# Patient Record
Sex: Male | Born: 1980 | Race: White | Hispanic: No | Marital: Married | Smoking: Current every day smoker
Health system: Southern US, Community
[De-identification: ages and names within clinical notes are randomized; demographics above are authoritative.]

## PROBLEM LIST (undated history)

## (undated) DIAGNOSIS — G43909 Migraine, unspecified, not intractable, without status migrainosus: Secondary | ICD-10-CM

## (undated) DIAGNOSIS — F419 Anxiety disorder, unspecified: Secondary | ICD-10-CM

## (undated) HISTORY — PX: WISDOM TOOTH EXTRACTION: SHX21

## (undated) HISTORY — DX: Anxiety disorder, unspecified: F41.9

## (undated) HISTORY — DX: Migraine, unspecified, not intractable, without status migrainosus: G43.909

---

## 2002-12-13 ENCOUNTER — Emergency Department (HOSPITAL_COMMUNITY): Admission: EM | Admit: 2002-12-13 | Discharge: 2002-12-14 | Payer: Self-pay | Admitting: Emergency Medicine

## 2002-12-13 ENCOUNTER — Encounter: Payer: Self-pay | Admitting: Emergency Medicine

## 2009-10-29 ENCOUNTER — Emergency Department (HOSPITAL_COMMUNITY): Admission: EM | Admit: 2009-10-29 | Discharge: 2009-10-29 | Payer: Self-pay | Admitting: Emergency Medicine

## 2010-08-09 LAB — CK TOTAL AND CKMB (NOT AT ARMC): Relative Index: INVALID (ref 0.0–2.5)

## 2010-08-09 LAB — CBC
HCT: 45.8 % (ref 39.0–52.0)
MCV: 99 fL (ref 78.0–100.0)
RBC: 4.62 MIL/uL (ref 4.22–5.81)
WBC: 12.6 10*3/uL — ABNORMAL HIGH (ref 4.0–10.5)

## 2010-08-09 LAB — COMPREHENSIVE METABOLIC PANEL
BUN: 19 mg/dL (ref 6–23)
CO2: 30 mEq/L (ref 19–32)
Calcium: 9.5 mg/dL (ref 8.4–10.5)
Chloride: 102 mEq/L (ref 96–112)
Creatinine, Ser: 1.09 mg/dL (ref 0.4–1.5)
GFR calc Af Amer: >60 mL/min (ref >60)
GFR calc non Af Amer: >60 mL/min (ref >60)
Glucose, Bld: 89 mg/dL (ref 70–99)
Total Bilirubin: 0.4 mg/dL (ref 0.3–1.2)

## 2010-08-09 LAB — DIFFERENTIAL
Basophils Absolute: 0.1 10*3/uL (ref 0.0–0.1)
Basophils Relative: 1 % (ref 0–1)
Neutro Abs: 8.7 10*3/uL — ABNORMAL HIGH (ref 1.7–7.7)
Neutrophils Relative %: 69 % (ref 43–77)

## 2010-08-09 LAB — TROPONIN I: Troponin I: <0.01 ng/mL (ref 0.00–0.06)

## 2012-10-03 ENCOUNTER — Other Ambulatory Visit: Payer: Self-pay | Admitting: Otolaryngology

## 2012-10-03 DIAGNOSIS — R2689 Other abnormalities of gait and mobility: Secondary | ICD-10-CM

## 2012-10-03 DIAGNOSIS — Z139 Encounter for screening, unspecified: Secondary | ICD-10-CM

## 2012-10-05 ENCOUNTER — Ambulatory Visit
Admission: RE | Admit: 2012-10-05 | Discharge: 2012-10-05 | Disposition: A | Discharge status: Home / Self Care | Payer: BC Managed Care – PPO | Source: Ambulatory Visit | Attending: Otolaryngology | Admitting: Otolaryngology

## 2012-10-05 DIAGNOSIS — Z139 Encounter for screening, unspecified: Secondary | ICD-10-CM

## 2012-10-05 DIAGNOSIS — R2689 Other abnormalities of gait and mobility: Secondary | ICD-10-CM

## 2012-10-05 MED ORDER — GADOBENATE DIMEGLUMINE 529 MG/ML IV SOLN
17.0000 mL | Freq: Once | INTRAVENOUS | Status: AC | PRN
  Administered 2012-10-05: 17 mL via INTRAVENOUS

## 2012-10-10 ENCOUNTER — Encounter: Payer: Self-pay | Admitting: Neurology

## 2012-10-10 DIAGNOSIS — R42 Dizziness and giddiness: Secondary | ICD-10-CM

## 2012-10-11 ENCOUNTER — Ambulatory Visit (INDEPENDENT_AMBULATORY_CARE_PROVIDER_SITE_OTHER): Payer: BC Managed Care – PPO | Admitting: Neurology

## 2012-10-11 ENCOUNTER — Encounter: Payer: Self-pay | Admitting: Neurology

## 2012-10-11 VITALS — BP 128/75 | HR 74 | Ht 69.0 in | Wt 181.5 lb

## 2012-10-11 DIAGNOSIS — R42 Dizziness and giddiness: Secondary | ICD-10-CM

## 2012-10-11 NOTE — Progress Notes (Signed)
History of present illness: Eddie Parrish is a 32 years old right-handed Caucasian male, referred by his primary care physician Dr. Duane Lope, and also ENT Dr. Jenne Pane for evaluation of constellation of complaints  He reported history of anxiety for 3 years, is under their psychiatrist Dr.Cottle, tried multiple medications in the past, currently taking Xanax 0 point 5 mg 4 times a day for 2 years  3 years ago, he presented with prolonged episode of dizziness, lightheadedness, anxiety, blurry vision, his body symptoms also increase his anxiety, was actually presented to the emergency room in 2011, CAT scan of the brain was normal, laboratory evaluation showed mild elevated WBC, otherwise normal CMP,  In 2013, over 3 months period of time, he had frequent ocular migraine, see wiggly lines in his visual field, lasting 15-20 minutes, followed by severe pounding headache, lasting for couple hours, he usually take Advil, Tylenol,  His symptoms overall has been stable over the past 3 years, especially with Xanax treatment, but he continued to feel lightheadedness, dizziness, body been pulled towards the right side  He had MRI of the brain May 2014, we  reviewed the films together, which was essentially normal.  He denies loss of vision, no lateralized motor or sensory deficit,     Review of Systems  Out of a complete 14 system review, the patient complains of only the following symptoms, and all other reviewed systems are negative.   Constitutional:   Fatigue Cardiovascular:  N/A Ear/Nose/Throat:  N/A Skin: N/A Eyes: Blurred vision  Respiratory: snoring  Gastroitestinal: N/A    Hematology/Lymphatic:  N/A Endocrine:  N/A Musculoskeletal:N/A Allergy/Immunology:  allergy  Neurological:  dizziness snoring  Psychiatric:    Anxiety not enough sleep decreased energy racing thoughts   PHYSICAL EXAMINATOINS:  Generalized: In no acute distress  Neck: Supple, no carotid bruits   Cardiac: Regular rate  rhythm  Pulmonary: Clear to auscultation bilaterally  Musculoskeletal: No deformity  Neurological examination  Mentation: Alert oriented to time, place, history taking, and causual conversation  Cranial nerve II-XII: Pupils were equal round reactive to light extraocular movements were full, visual field were full on confrontational test. facial sensation and strength were normal. hearing was intact to finger rubbing bilaterally. Uvula tongue midline.  head turning and shoulder shrug and were normal and symmetric.Tongue protrusion into cheek strength was normal.  Motor: normal tone, bulk and strength.  Sensory: Intact to fine touch, pinprick, preserved vibratory sensation, and proprioception at toes.  Coordination: Normal finger to nose, heel-to-shin bilaterally there was no truncal ataxia  Gait: Rising up from seated position without assistance, normal stance, without trunk ataxia, moderate stride, good arm swing, smooth turning, able to perform tiptoe, and heel walking without difficulty.   Romberg signs: Negative  Deep tendon reflexes: Brachioradialis 2/2, biceps 2/2, triceps 2/2, patellar 2/2, Achilles 2/2, plantar responses were flexor bilaterally.   Assessment plan,  32 years old right-handed Caucasian male, with past medical history of anxiety, and chronic and Xanax treatment, presenting with a 3 years history of intermittent dizziness, lightheadedness, body pulled towards the right side, ocular migraine, normal neurological examination, normal MRI of the brain  1 most consistent with anxiety 2 I have suggested him long term SSRI treatment, he will continue followup with his psychiatrist Dr. Jennelle Human 3 return to clinic as needed.

## 2015-09-25 ENCOUNTER — Ambulatory Visit
Admission: RE | Admit: 2015-09-25 | Discharge: 2015-09-25 | Disposition: A | Discharge status: Home / Self Care | Payer: Managed Care, Other (non HMO) | Source: Ambulatory Visit | Attending: Family Medicine | Admitting: Family Medicine

## 2015-09-25 ENCOUNTER — Other Ambulatory Visit: Payer: Self-pay | Admitting: Family Medicine

## 2015-09-25 DIAGNOSIS — J329 Chronic sinusitis, unspecified: Secondary | ICD-10-CM

## 2018-05-11 ENCOUNTER — Other Ambulatory Visit: Payer: Self-pay

## 2018-05-11 MED ORDER — ALPRAZOLAM 0.5 MG PO TABS: ORAL_TABLET | ORAL | 2 refills | Status: DC

## 2018-05-11 MED ORDER — ALPRAZOLAM 0.5 MG PO TABS: 2.0000 mg | ORAL_TABLET | Freq: Four times a day (QID) | ORAL | 2 refills | Status: DC

## 2018-06-26 ENCOUNTER — Encounter: Payer: Self-pay | Admitting: Emergency Medicine

## 2018-06-26 DIAGNOSIS — F4001 Agoraphobia with panic disorder: Secondary | ICD-10-CM

## 2018-06-26 DIAGNOSIS — F410 Panic disorder [episodic paroxysmal anxiety] without agoraphobia: Secondary | ICD-10-CM

## 2018-08-07 ENCOUNTER — Other Ambulatory Visit: Payer: Self-pay | Admitting: Psychiatry

## 2018-08-08 NOTE — Telephone Encounter (Signed)
Ok to call in unable to leave voicemail with pharmacy

## 2018-08-08 NOTE — Telephone Encounter (Signed)
Need to confirm paper chart not seen in epic

## 2018-09-27 ENCOUNTER — Ambulatory Visit: Payer: Self-pay | Admitting: Psychiatry

## 2018-09-27 ENCOUNTER — Other Ambulatory Visit: Payer: Self-pay

## 2018-10-11 ENCOUNTER — Encounter: Payer: Self-pay | Admitting: Psychiatry

## 2018-10-11 ENCOUNTER — Ambulatory Visit (INDEPENDENT_AMBULATORY_CARE_PROVIDER_SITE_OTHER): Payer: Managed Care, Other (non HMO) | Admitting: Psychiatry

## 2018-10-11 ENCOUNTER — Other Ambulatory Visit: Payer: Self-pay

## 2018-10-11 DIAGNOSIS — F4001 Agoraphobia with panic disorder: Secondary | ICD-10-CM | POA: Diagnosis not present

## 2018-10-11 MED ORDER — PAROXETINE HCL 20 MG PO TABS: 20.0000 mg | ORAL_TABLET | Freq: Every day | ORAL | 3 refills | Status: DC

## 2018-10-11 MED ORDER — ALPRAZOLAM 0.5 MG PO TABS: ORAL_TABLET | ORAL | 5 refills | Status: DC

## 2018-10-11 MED ORDER — ALPRAZOLAM ER 0.5 MG PO TB24: 0.5000 mg | ORAL_TABLET | Freq: Two times a day (BID) | ORAL | 5 refills | Status: DC

## 2018-10-11 NOTE — Progress Notes (Signed)
Eddie Parrish [id number] 1980-11-08 38 y.o.  Virtual Visit via Telephone Note  I connected with@ on 10/11/18 at 11:00 AM EDT by telephone and verified that I am speaking with the correct person using two identifiers.   I discussed the limitations, risks, security and privacy concerns of performing an evaluation and management service by telephone and the availability of in person appointments. I also discussed with the patient that there may be a patient responsible charge related to this service. The patient expressed understanding and agreed to proceed.   I discussed the assessment and treatment plan with the patient. The patient was provided an opportunity to ask questions and all were answered. The patient agreed with the plan and demonstrated an understanding of the instructions.   The patient was advised to call back or seek an in-person evaluation if the symptoms worsen or if the condition fails to improve as anticipated.  I provided 15 minutes of non-face-to-face time during this encounter.  The patient was located at home.  The provider was located at home.   Eddie Shoemaker, MD   Subjective:   Patient ID:  Eddie Parrish is a 38 y.o. (DOB October 29, 1980) male.  Chief Complaint:  Chief Complaint  Patient presents with  . Follow-up    Medication Management  . Anxiety    Medication Management    HPI Eddie Parrish presents for follow-up of panic and GAD.  Last seen August 2019.  No med changes made.  Pretty balanced.  Stable.  Happy with meds and feels he needs all the Xanax and it's smooth throughout the day.  Patient reports stable mood and denies depressed or irritable moods.  Anxiety manageable with meds.  Patient denies difficulty with sleep initiation or maintenance. Denies appetite disturbance.  Patient reports that energy and motivation have been good.  Patient denies any difficulty with concentration.  Patient denies any suicidal ideation.  Kids including baby and  wife Eddie Parrish.  [name] skin problem managed at [facility].  Past Psychiatric Medication Trials:  Zoloft worse anxiety, Klonopin, melatonin NR, trazodone   Review of Systems:  Review of Systems  Neurological: Negative for tremors and weakness.    Medications: I have reviewed the patient's current medications.  Current Outpatient Medications  Medication Sig Dispense Refill  . ALPRAZolam (XANAX) 0.5 MG tablet Take 1 tablet by mouth four times daily. 120 tablet 2  . ALPRAZOLAM XR 0.5 MG 24 hr tablet TAKE 1 TABLET BY MOUTH TWICE DAILY 60 tablet 2  . PARoxetine (PAXIL) 20 MG tablet Take 20 mg by mouth daily.     No current facility-administered medications for this visit.     Medication Side Effects: None  Allergies:  Allergies  Allergen Reactions  . Ativan [Lorazepam]   . Keflex [Cephalexin]     Past Medical History:  Diagnosis Date  . Anxiety   . Migraine     Family History  Problem Relation Age of Onset  . Cancer Unknown   . Lung cancer Unknown   . Liver cancer Unknown     Social History   Socioeconomic History  . Marital status: Married    Spouse name: Not on file  . Number of children: Not on file  . Years of education: Not on file  . Highest education level: Not on file  Occupational History  . Not on file  Social Needs  . Financial resource strain: Not on file  . Food insecurity:    Worry: Not on file  Inability: Not on file  . Transportation needs:    Medical: Not on file    Non-medical: Not on file  Tobacco Use  . Smoking status: Current Every Day Smoker    Packs/day: 0.50    Types: Cigarettes  . Smokeless tobacco: Never Used  Substance and Sexual Activity  . Alcohol use: Yes    Comment: 10 beers weekly  . Drug use: No  . Sexual activity: Not on file  Lifestyle  . Physical activity:    Days per week: Not on file    Minutes per session: Not on file  . Stress: Not on file  Relationships  . Social connections:    Talks on phone: Not on file     Gets together: Not on file    Attends religious service: Not on file    Active member of club or organization: Not on file    Attends meetings of clubs or organizations: Not on file    Relationship status: Not on file  . Intimate partner violence:    Fear of current or ex partner: Not on file    Emotionally abused: Not on file    Physically abused: Not on file    Forced sexual activity: Not on file  Other Topics Concern  . Not on file  Social History Narrative   Patient lives at home with his wife and has a high school education. Patient works at [employer].      Past Medical History, Surgical history, Social history, and Family history were reviewed and updated as appropriate.   Please see review of systems for further details on the patient's review from today.   Objective:   Physical Exam:  There were no vitals taken for this visit.  Physical Exam Neurological:     Mental Status: He is alert and oriented to person, place, and time.     Cranial Nerves: No dysarthria.  Psychiatric:        Attention and Perception: Attention normal.        Mood and Affect: Mood normal.        Speech: Speech normal.        Behavior: Behavior is cooperative.        Thought Content: Thought content normal. Thought content is not paranoid or delusional. Thought content does not include homicidal or suicidal ideation. Thought content does not include homicidal or suicidal plan.        Cognition and Memory: Cognition and memory normal.        Judgment: Judgment normal.     Comments: Anxiety managed.     Lab Review:     Component Value Date/Time   NA 137 10/29/2009 1530   K 4.3 10/29/2009 1530   CL 102 10/29/2009 1530   CO2 30 10/29/2009 1530   GLUCOSE 89 10/29/2009 1530   BUN 19 10/29/2009 1530   CREATININE 1.09 10/29/2009 1530   CALCIUM 9.5 10/29/2009 1530   PROT 7.8 10/29/2009 1530   ALBUMIN 4.7 10/29/2009 1530   AST 21 10/29/2009 1530   ALT 22 10/29/2009 1530   ALKPHOS 30 (L)  10/29/2009 1530   BILITOT 0.4 10/29/2009 1530   GFRNONAA >60 10/29/2009 1530   GFRAA  10/29/2009 1530    >60        The eGFR has been calculated using the MDRD equation. This calculation has not been validated in all clinical situations. eGFR's persistently <60 mL/min signify possible Chronic Kidney Disease.  Component Value Date/Time   WBC 12.6 (H) 10/29/2009 1530   RBC 4.62 10/29/2009 1530   HGB 15.7 10/29/2009 1530   HCT 45.8 10/29/2009 1530   PLT 226 10/29/2009 1530   MCV 99.0 10/29/2009 1530   MCHC 34.4 10/29/2009 1530   RDW 12.8 10/29/2009 1530   LYMPHSABS 2.8 10/29/2009 1530   MONOABS 0.8 10/29/2009 1530   EOSABS 0.1 10/29/2009 1530   BASOSABS 0.1 10/29/2009 1530    No results found for: POCLITH, LITHIUM   No results found for: PHENYTOIN, PHENOBARB, VALPROATE, CBMZ   .res Assessment: Plan:    Panic disorder with agoraphobia   Ej is had a good response with a combination of paroxetine plus Xanax for his panic disorder.  Attempts to reduce the Xanax have failed and had recurrence of panic.  Higher doses of paroxetine because side effects.  He gets a very smooth benefit and effect response using the combination of Xanax XR 0.5 mg twice daily and immediate release Xanax 0.5 4 times daily.  He is consistent.  There is no evidence of abuse or tolerance or misuse.  He will continue on Paxil 20 mg daily.  No med changes today  We discussed the short-term risks associated with benzodiazepines including sedation and increased fall risk among others.  Discussed long-term side effect risk including dependence, potential withdrawal symptoms, and the potential eventual dose-related risk of dementia.  Cautioned about use of alcohol with alprazolam.  Follow-up 1 year because of stability and consistency  Lynder Parents, MD, DFAPA Please see After Visit Summary for patient specific instructions.  No future appointments.  No orders of the defined types were placed in  this encounter.     -------------------------------

## 2019-04-27 ENCOUNTER — Other Ambulatory Visit: Payer: Self-pay | Admitting: Psychiatry

## 2019-04-27 DIAGNOSIS — F4001 Agoraphobia with panic disorder: Secondary | ICD-10-CM

## 2019-05-08 ENCOUNTER — Other Ambulatory Visit: Payer: Self-pay | Admitting: Psychiatry

## 2019-05-08 DIAGNOSIS — F4001 Agoraphobia with panic disorder: Secondary | ICD-10-CM

## 2019-05-09 NOTE — Telephone Encounter (Signed)
5 refills okay? Due back May 2021

## 2019-06-07 ENCOUNTER — Other Ambulatory Visit: Payer: Self-pay | Admitting: Psychiatry

## 2019-06-07 DIAGNOSIS — F4001 Agoraphobia with panic disorder: Secondary | ICD-10-CM

## 2019-06-11 ENCOUNTER — Telehealth: Payer: Self-pay | Admitting: Psychiatry

## 2019-06-11 ENCOUNTER — Telehealth: Payer: Self-pay

## 2019-06-11 NOTE — Telephone Encounter (Signed)
Prior authorization approved for Alprazolam XR 0.5 mg bid #60 for 30 days with [employer] ID# [id number]

## 2019-06-11 NOTE — Telephone Encounter (Signed)
Megan @ [facility] needs confirmation on Rx for PA. Alprazolam XR 2/d 24 hr tab. She stated should be 1/d as scheduled. Need ASAP request reads URGENT

## 2019-06-11 NOTE — Telephone Encounter (Signed)
Rtc to [facility], had to leave voicemail to confirm patient's dose of Alprazolam Xr 0.5 mg 1 bid. Hope that helps answer her question.

## 2019-08-23 ENCOUNTER — Ambulatory Visit: Payer: PRIVATE HEALTH INSURANCE | Attending: Internal Medicine

## 2019-08-23 DIAGNOSIS — Z23 Encounter for immunization: Secondary | ICD-10-CM

## 2019-08-23 NOTE — Progress Notes (Signed)
   Covid-19 Vaccination Clinic  Name:  Eddie Parrish    MRN: [id number] DOB: 04-23-1981  08/23/2019  Mr. Eddie Parrish was observed post Covid-19 immunization for 15 minutes without incident. He was provided with Vaccine Information Sheet and instruction to access the V-Safe system.   Mr. Eddie Parrish was instructed to call 911 with any severe reactions post vaccine: Marland Kitchen Difficulty breathing  . Swelling of face and throat  . A fast heartbeat  . A bad rash all over body  . Dizziness and weakness   Immunizations Administered    Name Date Dose VIS Date Route   Pfizer COVID-19 Vaccine 08/23/2019 11:12 AM 0.3 mL 05/03/2019 Intramuscular   Manufacturer: [employer], [employer]   Lot: [id number]   NDC: [address]-1

## 2019-09-16 ENCOUNTER — Ambulatory Visit: Payer: PRIVATE HEALTH INSURANCE | Attending: Internal Medicine

## 2019-09-16 DIAGNOSIS — Z23 Encounter for immunization: Secondary | ICD-10-CM

## 2019-09-16 NOTE — Progress Notes (Signed)
   Covid-19 Vaccination Clinic  Name:  Eddie Parrish    MRN: [id number] DOB: 07-06-1980  09/16/2019  Mr. [name] was observed post Covid-19 immunization for 15 minutes without incident. He was provided with Vaccine Information Sheet and instruction to access the V-Safe system.   Mr. [name] was instructed to call 911 with any severe reactions post vaccine: Marland Kitchen Difficulty breathing  . Swelling of face and throat  . A fast heartbeat  . A bad rash all over body  . Dizziness and weakness   Immunizations Administered    Name Date Dose VIS Date Route   Pfizer COVID-19 Vaccine 09/16/2019 12:39 PM 0.3 mL 07/17/2018 Intramuscular   Manufacturer: [employer], [employer]   Lot: [id number]   NDC: [address]-9

## 2019-10-07 ENCOUNTER — Other Ambulatory Visit: Payer: Self-pay | Admitting: Psychiatry

## 2019-10-07 DIAGNOSIS — F4001 Agoraphobia with panic disorder: Secondary | ICD-10-CM

## 2019-10-11 ENCOUNTER — Ambulatory Visit (INDEPENDENT_AMBULATORY_CARE_PROVIDER_SITE_OTHER): Payer: PRIVATE HEALTH INSURANCE | Admitting: Psychiatry

## 2019-10-11 ENCOUNTER — Encounter: Payer: Self-pay | Admitting: Psychiatry

## 2019-10-11 ENCOUNTER — Other Ambulatory Visit: Payer: Self-pay

## 2019-10-11 DIAGNOSIS — F4001 Agoraphobia with panic disorder: Secondary | ICD-10-CM

## 2019-10-11 MED ORDER — ALPRAZOLAM 0.5 MG PO TABS: 0.5000 mg | ORAL_TABLET | Freq: Four times a day (QID) | ORAL | 5 refills | Status: DC

## 2019-10-11 MED ORDER — ALPRAZOLAM ER 0.5 MG PO TB24: 0.5000 mg | ORAL_TABLET | Freq: Two times a day (BID) | ORAL | 5 refills | Status: DC

## 2019-10-11 MED ORDER — PAROXETINE HCL 20 MG PO TABS: 20.0000 mg | ORAL_TABLET | Freq: Every day | ORAL | 3 refills | Status: DC

## 2019-10-11 NOTE — Progress Notes (Signed)
Eddie Parrish [id number] [birth year] 39 y.o.    Subjective:   Patient ID:  Eddie Parrish is a 39 y.o. (DOB May 10, 1981) male.  Chief Complaint:  Chief Complaint  Patient presents with  . Follow-up  . Anxiety    HPI Eddie Parrish presents for follow-up of panic and GAD.  Last seen August 2019.  No med changes made.  Pretty balanced.  No panic.  Stress high.  Stable.  Happy with meds and feels he needs all the Xanax and it's smooth throughout the day.  Patient reports stable mood and denies depressed or irritable moods.  Anxiety manageable with meds.  Patient denies difficulty with sleep initiation or maintenance. Denies appetite disturbance.  Patient reports that energy and motivation have been good.  Patient denies any difficulty with concentration.  Patient denies any suicidal ideation.  Kids 20, 30, 20 yo including baby and wife [name].  [name] skin problem [facility].  Past Psychiatric Medication Trials:  Zoloft worse anxiety, Klonopin, melatonin NR, trazodone  Review of Systems:  Review of Systems  Cardiovascular: Negative for chest pain and palpitations.  Neurological: Negative for tremors and weakness.    Medications: I have reviewed the patient's current medications.  Current Outpatient Medications  Medication Sig Dispense Refill  . ALPRAZolam (ALPRAZOLAM XR) 0.5 MG 24 hr tablet Take 1 tablet (0.5 mg total) by mouth 2 (two) times daily. 60 tablet 5  . ALPRAZolam (XANAX) 0.5 MG tablet Take 1 tablet (0.5 mg total) by mouth 4 (four) times daily. 120 tablet 5  . PARoxetine (PAXIL) 20 MG tablet Take 1 tablet (20 mg total) by mouth daily. 90 tablet 3   No current facility-administered medications for this visit.    Medication Side Effects: None  Allergies:  Allergies  Allergen Reactions  . Ativan [Lorazepam]   . Keflex [Cephalexin]     Past Medical History:  Diagnosis Date  . Anxiety   . Migraine     Family History  Problem Relation Age of Onset  .  Cancer Unknown   . Lung cancer Unknown   . Liver cancer Unknown     Social History   Socioeconomic History  . Marital status: Married    Spouse name: Not on file  . Number of children: Not on file  . Years of education: Not on file  . Highest education level: Not on file  Occupational History  . Not on file  Tobacco Use  . Smoking status: Current Every Day Smoker    Packs/day: 0.50    Types: Cigarettes  . Smokeless tobacco: Never Used  Substance and Sexual Activity  . Alcohol use: Yes    Comment: 10 beers weekly  . Drug use: No  . Sexual activity: Not on file  Other Topics Concern  . Not on file  Social History Narrative   Patient lives at home with his wife and has a high school education. Patient works at [employer] Strain:   . Difficulty of Paying Living Expenses:   Food Insecurity:   . Worried About Charity fundraiser in the Last Year:   . Arboriculturist in the Last Year:   Transportation Needs:   . Film/video editor (Medical):   Marland Kitchen Lack of Transportation (Non-Medical):   Physical Activity:   . Days of Exercise per Week:   . Minutes of Exercise per Session:   Stress:   .  Feeling of Stress :   Social Connections:   . Frequency of Communication with Friends and Family:   . Frequency of Social Gatherings with Friends and Family:   . Attends Religious Services:   . Active Member of Clubs or Organizations:   . Attends Archivist Meetings:   Marland Kitchen Marital Status:   Intimate Partner Violence:   . Fear of Current or Ex-Partner:   . Emotionally Abused:   Marland Kitchen Physically Abused:   . Sexually Abused:     Past Medical History, Surgical history, Social history, and Family history were reviewed and updated as appropriate.   Please see review of systems for further details on the patient's review from today.   Objective:   Physical Exam:  There were no vitals taken for this visit.  Physical  Exam Constitutional:      General: He is not in acute distress. Musculoskeletal:        General: No deformity.  Neurological:     Mental Status: He is alert and oriented to person, place, and time.     Cranial Nerves: No dysarthria.     Coordination: Coordination normal.  Psychiatric:        Attention and Perception: Attention and perception normal. He does not perceive auditory or visual hallucinations.        Mood and Affect: Mood normal. Mood is not anxious or depressed. Affect is not labile, blunt, angry or inappropriate.        Speech: Speech normal.        Behavior: Behavior normal. Behavior is cooperative.        Thought Content: Thought content normal. Thought content is not paranoid or delusional. Thought content does not include homicidal or suicidal ideation. Thought content does not include homicidal or suicidal plan.        Cognition and Memory: Cognition and memory normal.        Judgment: Judgment normal.     Comments: Anxiety managed.     Lab Review:     Component Value Date/Time   NA 137 10/29/2009 1530   K 4.3 10/29/2009 1530   CL 102 10/29/2009 1530   CO2 30 10/29/2009 1530   GLUCOSE 89 10/29/2009 1530   BUN 19 10/29/2009 1530   CREATININE 1.09 10/29/2009 1530   CALCIUM 9.5 10/29/2009 1530   PROT 7.8 10/29/2009 1530   ALBUMIN 4.7 10/29/2009 1530   AST 21 10/29/2009 1530   ALT 22 10/29/2009 1530   ALKPHOS 30 (L) 10/29/2009 1530   BILITOT 0.4 10/29/2009 1530   GFRNONAA >60 10/29/2009 1530   GFRAA  10/29/2009 1530    >60        The eGFR has been calculated using the MDRD equation. This calculation has not been validated in all clinical situations. eGFR's persistently <60 mL/min signify possible Chronic Kidney Disease.       Component Value Date/Time   WBC 12.6 (H) 10/29/2009 1530   RBC 4.62 10/29/2009 1530   HGB 15.7 10/29/2009 1530   HCT 45.8 10/29/2009 1530   PLT 226 10/29/2009 1530   MCV 99.0 10/29/2009 1530   MCHC 34.4 10/29/2009 1530    RDW 12.8 10/29/2009 1530   LYMPHSABS 2.8 10/29/2009 1530   MONOABS 0.8 10/29/2009 1530   EOSABS 0.1 10/29/2009 1530   BASOSABS 0.1 10/29/2009 1530    No results found for: POCLITH, LITHIUM   No results found for: PHENYTOIN, PHENOBARB, VALPROATE, CBMZ   .res Assessment: Plan:    Panic disorder  with agoraphobia - Plan: ALPRAZolam (XANAX) 0.5 MG tablet, ALPRAZolam (ALPRAZOLAM XR) 0.5 MG 24 hr tablet, PARoxetine (PAXIL) 20 MG tablet   Vaishnav is had a good response with a combination of paroxetine plus Xanax for his panic disorder.  Attempts to reduce the Xanax have failed and had recurrence of panic.  Higher doses of paroxetine because side effects.  He gets a very smooth benefit and effect response using the combination of Xanax XR 0.5 mg twice daily and immediate release Xanax 0.5 4 times daily.  He is consistent.  There is no evidence of abuse or tolerance or misuse.  He will continue on Paxil 20 mg daily.  No med changes today  We discussed the short-term risks associated with benzodiazepines including sedation and increased fall risk among others.  Discussed long-term side effect risk including dependence, potential withdrawal symptoms, and the potential eventual dose-related risk of dementia.  But recent studies from 2020 dispute this association between benzodiazepines and dementia risk. Newer studies in 2020 do not support an association with dementia.  Cautioned about use of alcohol with alprazolam.  Follow-up 1 year because of stability and consistency  Lynder Parents, MD, DFAPA Please see After Visit Summary for patient specific instructions.  No future appointments.  No orders of the defined types were placed in this encounter.     -------------------------------

## 2020-03-30 ENCOUNTER — Other Ambulatory Visit: Payer: Self-pay | Admitting: Psychiatry

## 2020-03-30 DIAGNOSIS — F4001 Agoraphobia with panic disorder: Secondary | ICD-10-CM

## 2020-03-31 ENCOUNTER — Telehealth: Payer: Self-pay | Admitting: Psychiatry

## 2020-03-31 ENCOUNTER — Other Ambulatory Visit: Payer: Self-pay | Admitting: Psychiatry

## 2020-03-31 NOTE — Telephone Encounter (Signed)
noted 

## 2020-03-31 NOTE — Telephone Encounter (Signed)
Patient needs a refill on his Alprazolam called to [address] on [name], [place].

## 2020-03-31 NOTE — Telephone Encounter (Signed)
Prescription was sent earlier today.

## 2020-03-31 NOTE — Telephone Encounter (Signed)
Pt will need Xanax today.

## 2020-09-03 ENCOUNTER — Other Ambulatory Visit: Payer: Self-pay

## 2020-09-03 ENCOUNTER — Ambulatory Visit (INDEPENDENT_AMBULATORY_CARE_PROVIDER_SITE_OTHER): Payer: PRIVATE HEALTH INSURANCE | Admitting: Psychiatry

## 2020-09-03 ENCOUNTER — Encounter: Payer: Self-pay | Admitting: Psychiatry

## 2020-09-03 DIAGNOSIS — F4001 Agoraphobia with panic disorder: Secondary | ICD-10-CM

## 2020-09-03 MED ORDER — ALPRAZOLAM ER 0.5 MG PO TB24: 0.5000 mg | ORAL_TABLET | Freq: Two times a day (BID) | ORAL | 5 refills | Status: DC

## 2020-09-03 MED ORDER — ALPRAZOLAM 0.5 MG PO TABS: 0.5000 mg | ORAL_TABLET | Freq: Four times a day (QID) | ORAL | 5 refills | Status: DC

## 2020-09-03 MED ORDER — PAROXETINE HCL 20 MG PO TABS: 20.0000 mg | ORAL_TABLET | Freq: Every day | ORAL | 3 refills | Status: DC

## 2020-09-03 NOTE — Progress Notes (Signed)
Eddie Parrish [id number] 1980/09/21 40 y.o.    Subjective:   Patient ID:  Eddie Parrish is a 40 y.o. (DOB [birth year]) male.  Chief Complaint:  Chief Complaint  Patient presents with  . Follow-up  . Anxiety    HPI Eddie Parrish presents for follow-up of panic and GAD.  seen August 2019.  No med changes made.  10/11/19 appt without med changes noted: Pretty balanced.  No panic.  Stress high.  Stable.  Happy with meds and feels he needs all the Xanax and it's smooth throughout the day.  Patient reports stable mood and denies depressed or irritable moods.  Anxiety manageable with meds.  Patient denies difficulty with sleep initiation or maintenance. Denies appetite disturbance.  Patient reports that energy and motivation have been good.  Patient denies any difficulty with concentration.  Patient denies any suicidal ideation. Kids 72, 31, 31 yo including baby and wife Eddie Parrish.  [name] skin problem managed at [facility].  09/03/2020 appointment with following noted: Lost 15 #.   Out of alprazolam XR but it's helpful. To use the combo. Business as usual.  F 72 dx terminal pancreatic CA.  No surgical option.  He's helping out. No full panic or unmanageable anxiety. I feel normal. No SE. Anxeity manageable.  Patient reports stable mood and denies depressed or irritable moods.  Patient denies any recent difficulty with anxiety.  Patient denies difficulty with sleep initiation or maintenance. Denies appetite disturbance.  Patient reports that energy and motivation have been good.  Patient denies any difficulty with concentration.  Patient denies any suicidal ideation.   Past Psychiatric Medication Trials:  Zoloft worse anxiety, Klonopin, melatonin NR, trazodone  Review of Systems:  Review of Systems  Cardiovascular: Negative for palpitations.  Neurological: Negative for tremors and weakness.    Medications: I have reviewed the patient's current medications.  Current Outpatient Medications   Medication Sig Dispense Refill  . ALPRAZolam (ALPRAZOLAM XR) 0.5 MG 24 hr tablet Take 1 tablet (0.5 mg total) by mouth 2 (two) times daily. 60 tablet 5  . ALPRAZolam (XANAX) 0.5 MG tablet TAKE 1 TABLET BY MOUTH FOUR TIMES DAILY 120 tablet 5  . PARoxetine (PAXIL) 20 MG tablet Take 1 tablet (20 mg total) by mouth daily. 90 tablet 3   No current facility-administered medications for this visit.    Medication Side Effects: None  Allergies:  Allergies  Allergen Reactions  . Ativan [Lorazepam]   . Keflex [Cephalexin]     Past Medical History:  Diagnosis Date  . Anxiety   . Migraine     Family History  Problem Relation Age of Onset  . Cancer Other   . Lung cancer Other   . Liver cancer Other     Social History   Socioeconomic History  . Marital status: Married    Spouse name: Not on file  . Number of children: Not on file  . Years of education: Not on file  . Highest education level: Not on file  Occupational History  . Not on file  Tobacco Use  . Smoking status: Current Every Day Smoker    Packs/day: 0.50    Types: Cigarettes  . Smokeless tobacco: Never Used  Substance and Sexual Activity  . Alcohol use: Yes    Comment: 10 beers weekly  . Drug use: No  . Sexual activity: Not on file  Other Topics Concern  . Not on file  Social History Narrative   Patient lives at home with his  wife and has a high school education. Patient works at [employer].     Social Determinants of Radio broadcast assistant Strain: Not on [employer] Insecurity: Not on file  Transportation Needs: Not on file  Physical Activity: Not on file  Stress: Not on file  Social Connections: Not on file  Intimate Partner Violence: Not on file    Past Medical History, Surgical history, Social history, and Family history were reviewed and updated as appropriate.   Please see review of systems for further details on the patient's review from today.   Objective:   Physical Exam:  There were  no vitals taken for this visit.  Physical Exam Constitutional:      General: He is not in acute distress. Musculoskeletal:        General: No deformity.  Neurological:     Mental Status: He is alert and oriented to person, place, and time.     Cranial Nerves: No dysarthria.     Coordination: Coordination normal.  Psychiatric:        Attention and Perception: Attention and perception normal. He does not perceive auditory or visual hallucinations.        Mood and Affect: Mood normal. Mood is not anxious or depressed. Affect is not labile, blunt, angry or inappropriate.        Speech: Speech normal.        Behavior: Behavior normal. Behavior is not slowed. Behavior is cooperative.        Thought Content: Thought content normal. Thought content is not paranoid or delusional. Thought content does not include homicidal or suicidal ideation. Thought content does not include homicidal or suicidal plan.        Cognition and Memory: Cognition and memory normal.        Judgment: Judgment normal.     Comments: Anxiety managed.     Lab Review:     Component Value Date/Time   NA 137 10/29/2009 1530   K 4.3 10/29/2009 1530   CL 102 10/29/2009 1530   CO2 30 10/29/2009 1530   GLUCOSE 89 10/29/2009 1530   BUN 19 10/29/2009 1530   CREATININE 1.09 10/29/2009 1530   CALCIUM 9.5 10/29/2009 1530   PROT 7.8 10/29/2009 1530   ALBUMIN 4.7 10/29/2009 1530   AST 21 10/29/2009 1530   ALT 22 10/29/2009 1530   ALKPHOS 30 (L) 10/29/2009 1530   BILITOT 0.4 10/29/2009 1530   GFRNONAA >60 10/29/2009 1530   GFRAA  10/29/2009 1530    >60        The eGFR has been calculated using the MDRD equation. This calculation has not been validated in all clinical situations. eGFR's persistently <60 mL/min signify possible Chronic Kidney Disease.       Component Value Date/Time   WBC 12.6 (H) 10/29/2009 1530   RBC 4.62 10/29/2009 1530   HGB 15.7 10/29/2009 1530   HCT 45.8 10/29/2009 1530   PLT 226  10/29/2009 1530   MCV 99.0 10/29/2009 1530   MCHC 34.4 10/29/2009 1530   RDW 12.8 10/29/2009 1530   LYMPHSABS 2.8 10/29/2009 1530   MONOABS 0.8 10/29/2009 1530   EOSABS 0.1 10/29/2009 1530   BASOSABS 0.1 10/29/2009 1530    No results found for: POCLITH, LITHIUM   No results found for: PHENYTOIN, PHENOBARB, VALPROATE, CBMZ   .res Assessment: Plan:    Panic disorder with agoraphobia   Eddie Parrish is had a good response with a combination of paroxetine plus Xanax for  his panic disorder.  Attempts to reduce the Xanax have failed and had recurrence of panic.  Higher doses of paroxetine because side effects.  He gets a very smooth benefit and effect response using the combination of Xanax XR 0.5 mg twice daily and immediate release Xanax 0.5 4 times daily.  He is consistent.  There is no evidence of abuse or tolerance or misuse.  He will continue on Paxil 20 mg daily.  No med changes today  We discussed the short-term risks associated with benzodiazepines including sedation and increased fall risk among others.  Discussed long-term side effect risk including dependence, potential withdrawal symptoms, and the potential eventual dose-related risk of dementia.  But recent studies from 2020 dispute this association between benzodiazepines and dementia risk. Newer studies in 2020 do not support an association with dementia.  Cautioned about use of alcohol with alprazolam.  Follow-up 1 year because of stability and consistency  Eddie Parents, MD, DFAPA   Please see After Visit Summary for patient specific instructions.  No future appointments.  No orders of the defined types were placed in this encounter.     -------------------------------

## 2020-09-23 ENCOUNTER — Other Ambulatory Visit: Payer: Self-pay | Admitting: Psychiatry

## 2020-09-23 DIAGNOSIS — F4001 Agoraphobia with panic disorder: Secondary | ICD-10-CM

## 2020-09-23 NOTE — Telephone Encounter (Signed)
Last filled 08/24/20 

## 2020-10-12 ENCOUNTER — Telehealth: Payer: Self-pay

## 2020-10-12 NOTE — Telephone Encounter (Signed)
Prior Approval approved for ALPRAZOLAM ER 0.5 MG #60 with [facility] Rx ID# [id number] effective 10/12/2020-10/12/2021

## 2020-10-16 ENCOUNTER — Other Ambulatory Visit: Payer: Self-pay | Admitting: Psychiatry

## 2020-10-16 DIAGNOSIS — F4001 Agoraphobia with panic disorder: Secondary | ICD-10-CM

## 2020-10-21 ENCOUNTER — Telehealth: Payer: Self-pay | Admitting: Psychiatry

## 2020-10-21 NOTE — Telephone Encounter (Signed)
Pt called to advise all meds should go to [facility] location. Not [facility] location. Please canc all Rx @ [facility] and send to [facility].This has been a mess since apt 4/14 Pt stated. Alprazolam XR 0.5 mg 24 hr tab has never been filled. PA approved 5/23. Send to [facility]. Any questions call pt @ [phone]

## 2020-10-22 ENCOUNTER — Other Ambulatory Visit: Payer: Self-pay

## 2020-10-22 DIAGNOSIS — F4001 Agoraphobia with panic disorder: Secondary | ICD-10-CM

## 2020-10-22 MED ORDER — PAROXETINE HCL 20 MG PO TABS: ORAL_TABLET | ORAL | 3 refills | Status: DC

## 2020-10-22 MED ORDER — ALPRAZOLAM ER 0.5 MG PO TB24: 0.5000 mg | ORAL_TABLET | Freq: Two times a day (BID) | ORAL | 5 refills | Status: DC

## 2020-10-22 NOTE — Telephone Encounter (Signed)
Last refill Alprazolam 0.5 mg 05/06, with refills at correct pharmacy. Pt does not have previous refill for the XR Pended XR and Paxil to be sent.

## 2021-01-15 ENCOUNTER — Other Ambulatory Visit: Payer: Self-pay | Admitting: Family Medicine

## 2021-01-15 ENCOUNTER — Other Ambulatory Visit: Payer: Self-pay

## 2021-01-15 ENCOUNTER — Ambulatory Visit
Admission: RE | Admit: 2021-01-15 | Discharge: 2021-01-15 | Disposition: A | Discharge status: Home / Self Care | Payer: No Typology Code available for payment source | Source: Ambulatory Visit | Attending: Family Medicine | Admitting: Family Medicine

## 2021-01-15 DIAGNOSIS — M25512 Pain in left shoulder: Secondary | ICD-10-CM

## 2021-03-26 ENCOUNTER — Other Ambulatory Visit: Payer: Self-pay | Admitting: Psychiatry

## 2021-03-26 DIAGNOSIS — F4001 Agoraphobia with panic disorder: Secondary | ICD-10-CM

## 2021-03-26 MED ORDER — ALPRAZOLAM 0.5 MG PO TABS: ORAL_TABLET | ORAL | 5 refills | Status: DC

## 2021-03-26 NOTE — Telephone Encounter (Signed)
Pt called wanting refill of regular Adderall transferred to:   [facility] #[id number] [place], [place] - [address] AT [facility] [address]  [address] [place] [address]  Phone:  [phone]  Fax:  [phone]   Next appt 4/17

## 2021-05-24 ENCOUNTER — Other Ambulatory Visit: Payer: Self-pay | Admitting: Psychiatry

## 2021-05-24 DIAGNOSIS — F4001 Agoraphobia with panic disorder: Secondary | ICD-10-CM

## 2021-09-06 ENCOUNTER — Ambulatory Visit (INDEPENDENT_AMBULATORY_CARE_PROVIDER_SITE_OTHER): Payer: PRIVATE HEALTH INSURANCE | Admitting: Psychiatry

## 2021-09-06 ENCOUNTER — Encounter: Payer: Self-pay | Admitting: Psychiatry

## 2021-09-06 DIAGNOSIS — F4001 Agoraphobia with panic disorder: Secondary | ICD-10-CM | POA: Diagnosis not present

## 2021-09-06 MED ORDER — ALPRAZOLAM 0.5 MG PO TABS: ORAL_TABLET | ORAL | 5 refills | Status: DC

## 2021-09-06 MED ORDER — PAROXETINE HCL 20 MG PO TABS: ORAL_TABLET | ORAL | 3 refills | Status: DC

## 2021-09-06 MED ORDER — ALPRAZOLAM ER 0.5 MG PO TB24: 0.5000 mg | ORAL_TABLET | Freq: Two times a day (BID) | ORAL | 5 refills | Status: DC

## 2021-09-06 NOTE — Progress Notes (Signed)
WILBER FINI ?[id number] ?11-01-1980 ?41 y.o. ? ? ? ?Subjective:  ? ?Patient ID:  Eddie Parrish is a 41 y.o. (DOB 1981-04-27) male. ? ?Chief Complaint:  ?Chief Complaint  ?Patient presents with  ? Follow-up  ? Anxiety  ? ? ?HPI ?Marella Chimes presents for follow-up of panic and GAD. ? ?seen August 2019.  No med changes made. ? ?10/11/19 appt without med changes noted: ?Parrish balanced.  No panic.  Stress high.  Stable.  Happy with meds and feels he needs all the Xanax and it's smooth throughout the day.  Patient reports stable mood and denies depressed or irritable moods.  Anxiety manageable with meds.  Patient denies difficulty with sleep initiation or maintenance. Denies appetite disturbance.  Patient reports that energy and motivation have been good.  Patient denies any difficulty with concentration.  Patient denies any suicidal ideation. ?Kids 62, 57, 92 yo including baby and wife [name].  [name] skin problem managed at [facility]. ? ?09/03/2020 appointment with following noted: ?Lost 15 #.   ?Out of alprazolam XR but it's helpful. To use the combo. ?Business as usual.  ?F 72 dx terminal pancreatic CA.  No surgical option.  He's helping out. ?No full panic or unmanageable anxiety. I feel normal. ?No SE. ?Anxeity manageable.  Patient reports stable mood and denies depressed or irritable moods.  Patient denies any recent difficulty with anxiety.  Patient denies difficulty with sleep initiation or maintenance. Denies appetite disturbance.  Patient reports that energy and motivation have been good.  Patient denies any difficulty with concentration.  Patient denies any suicidal ideation. ?Plan:  He gets a very smooth benefit and effect response using the combination of Xanax XR 0.5 mg twice daily and immediate release Xanax 0.5 4 times daily.  He is consistent.  There is no evidence of abuse or tolerance or misuse.  He will continue on Paxil 20 mg daily. ? ?09/06/2021 appointment with the following noted: ?Stayed on meds.    ?Healthy and family well. ?Youngest [name] hives 35 yo. ?Wife [name] doing well.   ?Doing well still with meds.  Anxiety managed and pleased with meds. ?Not close to panic.   ?F hospice care.Pancreatic CA ? ?Past Psychiatric Medication Trials:  Zoloft worse anxiety, Klonopin, melatonin NR, trazodone ? ?Review of Systems:  ?Review of Systems  ?Cardiovascular:  Negative for palpitations.  ?Neurological:  Negative for tremors and weakness.  ? ?Medications: I have reviewed the patient's current medications. ? ?Current Outpatient Medications  ?Medication Sig Dispense Refill  ? ALPRAZolam (XANAX XR) 0.5 MG 24 hr tablet Take 1 tablet (0.5 mg total) by mouth 2 (two) times daily. 60 tablet 5  ? ALPRAZolam (XANAX) 0.5 MG tablet TAKE 1 TABLET(0.5 MG) BY MOUTH FOUR TIMES DAILY 120 tablet 5  ? PARoxetine (PAXIL) 20 MG tablet TAKE 1 TABLET(20 MG) BY MOUTH DAILY 90 tablet 3  ? ?No current facility-administered medications for this visit.  ? ? ?Medication Side Effects: None ? ?Allergies:  ?Allergies  ?Allergen Reactions  ? Ativan [Lorazepam]   ? Keflex [Cephalexin]   ? ? ?Past Medical History:  ?Diagnosis Date  ? Anxiety   ? Migraine   ? ? ?Family History  ?Problem Relation Age of Onset  ? Cancer Other   ? Lung cancer Other   ? Liver cancer Other   ? ? ?Social History  ? ?Socioeconomic History  ? Marital status: Married  ?  Spouse name: Not on file  ? Number of children: Not on file  ?  Years of education: Not on file  ? Highest education level: Not on file  ?Occupational History  ? Not on file  ?Tobacco Use  ? Smoking status: Every Day  ?  Packs/day: 0.50  ?  Types: Cigarettes  ? Smokeless tobacco: Never  ?Substance and Sexual Activity  ? Alcohol use: Yes  ?  Comment: 10 beers weekly  ? Drug use: No  ? Sexual activity: Not on file  ?Other Topics Concern  ? Not on file  ?Social History Narrative  ? Patient lives at home with his wife and has a high school education. Patient works at [employer].    ? ?Social Determinants of Health   ? ?Financial Resource Strain: Not on file  ?Food Insecurity: Not on file  ?Transportation Needs: Not on file  ?Physical Activity: Not on file  ?Stress: Not on file  ?Social Connections: Not on file  ?Intimate Partner Violence: Not on file  ? ? ?Past Medical History, Surgical history, Social history, and Family history were reviewed and updated as appropriate.  ? ?Please see review of systems for further details on the patient's review from today.  ? ?Objective:  ? ?Physical Exam:  ?There were no vitals taken for this visit. ? ?Physical Exam ?Constitutional:   ?   General: He is not in acute distress. ?Musculoskeletal:     ?   General: No deformity.  ?Neurological:  ?   Mental Status: He is alert and oriented to person, place, and time.  ?   Cranial Nerves: No dysarthria.  ?   Coordination: Coordination normal.  ?Psychiatric:     ?   Attention and Perception: Attention and perception normal. He does not perceive auditory or visual hallucinations.     ?   Mood and Affect: Mood normal. Mood is not anxious or depressed. Affect is not labile, blunt, angry or inappropriate.     ?   Speech: Speech normal.     ?   Behavior: Behavior normal. Behavior is not slowed. Behavior is cooperative.     ?   Thought Content: Thought content normal. Thought content is not paranoid or delusional. Thought content does not include homicidal or suicidal ideation. Thought content does not include homicidal or suicidal plan.     ?   Cognition and Memory: Cognition and memory normal.     ?   Judgment: Judgment normal.  ?   Comments: Anxiety managed.  ? ? ?Lab Review:  ?   ?Component Value Date/Time  ? NA 137 10/29/2009 1530  ? K 4.3 10/29/2009 1530  ? CL 102 10/29/2009 1530  ? CO2 30 10/29/2009 1530  ? GLUCOSE 89 10/29/2009 1530  ? BUN 19 10/29/2009 1530  ? CREATININE 1.09 10/29/2009 1530  ? CALCIUM 9.5 10/29/2009 1530  ? PROT 7.8 10/29/2009 1530  ? ALBUMIN 4.7 10/29/2009 1530  ? AST 21 10/29/2009 1530  ? ALT 22 10/29/2009 1530  ? ALKPHOS 30  (L) 10/29/2009 1530  ? BILITOT 0.4 10/29/2009 1530  ? GFRNONAA >60 10/29/2009 1530  ? GFRAA  10/29/2009 1530  ?  >60        ?The eGFR has been calculated ?using the MDRD equation. ?This calculation has not been ?validated in all clinical ?situations. ?eGFR's persistently ?<60 mL/min signify ?possible Chronic Kidney Disease.  ? ? ?   ?Component Value Date/Time  ? WBC 12.6 (H) 10/29/2009 1530  ? RBC 4.62 10/29/2009 1530  ? HGB 15.7 10/29/2009 1530  ? HCT 45.8 10/29/2009 1530  ?  PLT 226 10/29/2009 1530  ? MCV 99.0 10/29/2009 1530  ? MCHC 34.4 10/29/2009 1530  ? RDW 12.8 10/29/2009 1530  ? LYMPHSABS 2.8 10/29/2009 1530  ? MONOABS 0.8 10/29/2009 1530  ? EOSABS 0.1 10/29/2009 1530  ? BASOSABS 0.1 10/29/2009 1530  ? ? ?No results found for: POCLITH, LITHIUM  ? ?No results found for: PHENYTOIN, PHENOBARB, VALPROATE, CBMZ  ? ?.res ?Assessment: Plan:   ? ?Panic disorder with agoraphobia - Plan: ALPRAZolam (XANAX XR) 0.5 MG 24 hr tablet, ALPRAZolam (XANAX) 0.5 MG tablet, PARoxetine (PAXIL) 20 MG tablet  ? ?Jahkai is had a good response with a combination of paroxetine plus Xanax for his panic disorder.  Attempts to reduce the Xanax have failed and had recurrence of panic.  Higher doses of paroxetine because side effects.  He gets a very smooth benefit and effect response using the combination of Xanax XR 0.5 mg twice daily and immediate release Xanax 0.5 4 times daily.  He is consistent.  There is no evidence of abuse or tolerance or misuse.  He will continue on Paxil 20 mg daily. ? ?No med changes today ? ?We discussed the short-term risks associated with benzodiazepines including sedation and increased fall risk among others.  Discussed long-term side effect risk including dependence, potential withdrawal symptoms, and the potential eventual dose-related risk of dementia.  But recent studies from 2020 dispute this association between benzodiazepines and dementia risk. Newer studies in 2020 do not support an association with  dementia. ? Cautioned about use of alcohol with alprazolam. ? ?Supportive therapy dealing with father dying with pancreatic CA ? ?Follow-up 1 year because of stability and consistency ? ?Lynder Parents, MD,

## 2022-03-25 ENCOUNTER — Other Ambulatory Visit: Payer: Self-pay

## 2022-03-25 ENCOUNTER — Telehealth: Payer: Self-pay | Admitting: Psychiatry

## 2022-03-25 DIAGNOSIS — F4001 Agoraphobia with panic disorder: Secondary | ICD-10-CM

## 2022-03-25 MED ORDER — ALPRAZOLAM ER 0.5 MG PO TB24: 0.5000 mg | ORAL_TABLET | Freq: Two times a day (BID) | ORAL | 5 refills | Status: DC

## 2022-03-25 MED ORDER — ALPRAZOLAM 0.5 MG PO TABS: ORAL_TABLET | ORAL | 5 refills | Status: DC

## 2022-03-25 NOTE — Telephone Encounter (Signed)
Next visit is 09/08/22. Requesting refill on Alprazolam 0.5 mg. Most current refill was 02/21/22. Pharmacy is:  [facility] #[id number] [address], [place]   Phone: [phone]  Fax: [phone]

## 2022-03-25 NOTE — Telephone Encounter (Signed)
Pended.

## 2022-09-08 ENCOUNTER — Encounter: Payer: Self-pay | Admitting: Psychiatry

## 2022-09-08 ENCOUNTER — Ambulatory Visit: Payer: PRIVATE HEALTH INSURANCE | Admitting: Psychiatry

## 2022-09-08 DIAGNOSIS — F4001 Agoraphobia with panic disorder: Secondary | ICD-10-CM

## 2022-09-08 DIAGNOSIS — F17210 Nicotine dependence, cigarettes, uncomplicated: Secondary | ICD-10-CM

## 2022-09-08 MED ORDER — ALPRAZOLAM 0.5 MG PO TABS: ORAL_TABLET | ORAL | 5 refills | Status: DC

## 2022-09-08 MED ORDER — VARENICLINE TARTRATE 1 MG PO TABS: ORAL_TABLET | ORAL | 3 refills | Status: DC

## 2022-09-08 MED ORDER — PAROXETINE HCL 20 MG PO TABS: ORAL_TABLET | ORAL | 3 refills | Status: DC

## 2022-09-08 MED ORDER — ALPRAZOLAM ER 0.5 MG PO TB24: 0.5000 mg | ORAL_TABLET | Freq: Two times a day (BID) | ORAL | 5 refills | Status: DC

## 2022-09-08 NOTE — Progress Notes (Signed)
Eddie Parrish [id number] Aug 15, 1980 42 y.o.    Subjective:   Patient ID:  Eddie Parrish is a 42 y.o. (DOB [birth year]) male.  Chief Complaint:  Chief Complaint  Patient presents with   Follow-up   Anxiety    HPI Eddie Parrish presents for follow-up of panic and GAD.  seen August 2019.  No med changes made.  10/11/19 appt without med changes noted: Pretty balanced.  No panic.  Stress high.  Stable.  Happy with meds and feels he needs all the Xanax and it's smooth throughout the day.  Patient reports stable mood and denies depressed or irritable moods.  Anxiety manageable with meds.  Patient denies difficulty with sleep initiation or maintenance. Denies appetite disturbance.  Patient reports that energy and motivation have been good.  Patient denies any difficulty with concentration.  Patient denies any suicidal ideation. Kids 7, 6, 21 yo including baby and wife Eddie Parrish.  Eddie Parrish skin problem managed at [facility].  09/03/2020 appointment with following noted: Lost 15 #.   Out of alprazolam XR but it's helpful. To use the combo. Business as usual.  F 72 dx terminal pancreatic CA.  No surgical option.  He's helping out. No full panic or unmanageable anxiety. I feel normal. No SE. Anxeity manageable.  Patient reports stable mood and denies depressed or irritable moods.  Patient denies any recent difficulty with anxiety.  Patient denies difficulty with sleep initiation or maintenance. Denies appetite disturbance.  Patient reports that energy and motivation have been good.  Patient denies any difficulty with concentration.  Patient denies any suicidal ideation. Plan:  He gets a very smooth benefit and effect response using the combination of Xanax XR 0.5 mg twice daily and immediate release Xanax 0.5 4 times daily.  He is consistent.  There is no evidence of abuse or tolerance or misuse.  He will continue on Paxil 20 mg daily.  09/06/2021 appointment with the following noted: Stayed on meds.    Healthy and family well. Youngest Eddie Parrish hives 5 yo. Wife Eddie Parrish doing well.   Doing well still with meds.  Anxiety managed and pleased with meds. Not close to panic.   F hospice care.Pancreatic CA Plan no med changes  09/08/22 appt noted: Continues combination of Xanax XR 0.5 mg twice daily and immediate release Xanax 0.5 4 times daily.  He is consistent. Paxil 20 mg daily. Good routine with Xanax schedule. Anxiety is managed.    Can get triggered if late with meds.  Can notice minor withdrawal if a coupld of hours late with meds.   No panic.  No sig avoidance.  No depression. Sleep pretty good. Managing same routine. No tolerance. No SE concerns.   Past Psychiatric Medication Trials:  Zoloft worse anxiety,  Xanax, Klonopin,  melatonin NR, trazodone  Review of Systems:  Review of Systems  Cardiovascular:  Negative for palpitations.  Neurological:  Negative for tremors.    Medications: I have reviewed the patient's current medications.  Current Outpatient Medications  Medication Sig Dispense Refill   varenicline (CHANTIX CONTINUING MONTH PAK) 1 MG tablet 1/2 tablet twice daily for 1 week, then 1 twice daily 60 tablet 3   ALPRAZolam (XANAX XR) 0.5 MG 24 hr tablet Take 1 tablet (0.5 mg total) by mouth 2 (two) times daily. 60 tablet 5   ALPRAZolam (XANAX) 0.5 MG tablet TAKE 1 TABLET(0.5 MG) BY MOUTH FOUR TIMES DAILY 120 tablet 5   PARoxetine (PAXIL) 20 MG tablet TAKE 1 TABLET(20 MG) BY  MOUTH DAILY 90 tablet 3   No current facility-administered medications for this visit.    Medication Side Effects: None  Allergies:  Allergies  Allergen Reactions   Ativan [Lorazepam]    Keflex [Cephalexin]     Past Medical History:  Diagnosis Date   Anxiety    Migraine     Family History  Problem Relation Age of Onset   Cancer Other    Lung cancer Other    Liver cancer Other     Social History   Socioeconomic History   Marital status: Married    Spouse name: Not on file    Number of children: Not on file   Years of education: Not on file   Highest education level: Not on file  Occupational History   Not on file  Tobacco Use   Smoking status: Every Day    Packs/day: .5    Types: Cigarettes   Smokeless tobacco: Never  Substance and Sexual Activity   Alcohol use: Yes    Comment: 10 beers weekly   Drug use: No   Sexual activity: Not on file  Other Topics Concern   Not on file  Social History Narrative   Patient lives at home with his wife and has a high school education. Patient works at [employer].     Social Determinants of Corporate investment banker Strain: Not on [employer] Insecurity: Not on file  Transportation Needs: Not on file  Physical Activity: Not on file  Stress: Not on file  Social Connections: Not on file  Intimate Partner Violence: Not on file    Past Medical History, Surgical history, Social history, and Family history were reviewed and updated as appropriate.   Please see review of systems for further details on the patient's review from today.   Objective:   Physical Exam:  There were no vitals taken for this visit.  Physical Exam Constitutional:      General: He is not in acute distress. Musculoskeletal:        General: No deformity.  Neurological:     Mental Status: He is alert and oriented to person, place, and time.     Cranial Nerves: No dysarthria.     Coordination: Coordination normal.  Psychiatric:        Attention and Perception: Attention and perception normal. He does not perceive auditory or visual hallucinations.        Mood and Affect: Mood normal. Mood is not anxious or depressed. Affect is not labile, blunt or inappropriate.        Speech: Speech normal.        Behavior: Behavior normal. Behavior is not slowed. Behavior is cooperative.        Thought Content: Thought content normal. Thought content is not paranoid or delusional. Thought content does not include homicidal or suicidal ideation.  Thought content does not include suicidal plan.        Cognition and Memory: Cognition and memory normal.        Judgment: Judgment normal.     Comments: Anxiety managed.     Lab Review:     Component Value Date/Time   NA 137 10/29/2009 1530   K 4.3 10/29/2009 1530   CL 102 10/29/2009 1530   CO2 30 10/29/2009 1530   GLUCOSE 89 10/29/2009 1530   BUN 19 10/29/2009 1530   CREATININE 1.09 10/29/2009 1530   CALCIUM 9.5 10/29/2009 1530   PROT 7.8 10/29/2009 1530   ALBUMIN  4.7 10/29/2009 1530   AST 21 10/29/2009 1530   ALT 22 10/29/2009 1530   ALKPHOS 30 (L) 10/29/2009 1530   BILITOT 0.4 10/29/2009 1530   GFRNONAA >60 10/29/2009 1530   GFRAA  10/29/2009 1530    >60        The eGFR has been calculated using the MDRD equation. This calculation has not been validated in all clinical situations. eGFR's persistently <60 mL/min signify possible Chronic Kidney Disease.       Component Value Date/Time   WBC 12.6 (H) 10/29/2009 1530   RBC 4.62 10/29/2009 1530   HGB 15.7 10/29/2009 1530   HCT 45.8 10/29/2009 1530   PLT 226 10/29/2009 1530   MCV 99.0 10/29/2009 1530   MCHC 34.4 10/29/2009 1530   RDW 12.8 10/29/2009 1530   LYMPHSABS 2.8 10/29/2009 1530   MONOABS 0.8 10/29/2009 1530   EOSABS 0.1 10/29/2009 1530   BASOSABS 0.1 10/29/2009 1530    No results found for: "POCLITH", "LITHIUM"   No results found for: "PHENYTOIN", "PHENOBARB", "VALPROATE", "CBMZ"   .res Assessment: Plan:    Panic disorder with agoraphobia - Plan: ALPRAZolam (XANAX) 0.5 MG tablet, ALPRAZolam (XANAX XR) 0.5 MG 24 hr tablet, PARoxetine (PAXIL) 20 MG tablet  Cigarette nicotine dependence, uncomplicated - Plan: varenicline (CHANTIX CONTINUING MONTH PAK) 1 MG tablet   Dhillon is had a good response with a combination of paroxetine plus Xanax for his panic disorder.  Attempts to reduce the Xanax have failed and had recurrence of panic.  Higher doses of paroxetine because side effects.  He gets a very  smooth benefit and effect response using the combination of Xanax XR 0.5 mg twice daily and immediate release Xanax 0.5 4 times daily.  He is consistent.   He will continue on Paxil 20 mg daily. There is no evidence of abuse or tolerance or misuse.  No med changes today nor indicated.  We discussed the short-term risks associated with benzodiazepines including sedation and increased fall risk among others.  Discussed long-term side effect risk including dependence, potential withdrawal symptoms, and the potential eventual dose-related risk of dementia.  But recent studies from 2020 dispute this association between benzodiazepines and dementia risk. Newer studies in 2020 do not support an association with dementia.  Disc studies showing minimal risk of tolerance.    Cautioned about use of alcohol with alprazolam.  Supportive therapy dealing with father dying with pancreatic CA  Rec stop smoking.  Extensive discussion of ways to do it.  Extensive discussion abotu SE Chantix.    Follow-up 1 year because of stability and consistency  Meredith Staggers, MD, DFAPA   Please see After Visit Summary for patient specific instructions.  No future appointments.  No orders of the defined types were placed in this encounter.     -------------------------------

## 2022-09-24 ENCOUNTER — Other Ambulatory Visit: Payer: Self-pay | Admitting: Psychiatry

## 2022-09-24 DIAGNOSIS — F4001 Agoraphobia with panic disorder: Secondary | ICD-10-CM

## 2023-04-21 ENCOUNTER — Other Ambulatory Visit: Payer: Self-pay | Admitting: Psychiatry

## 2023-04-21 DIAGNOSIS — F4001 Agoraphobia with panic disorder: Secondary | ICD-10-CM

## 2023-04-24 NOTE — Telephone Encounter (Signed)
PtLVM12/1 @ 9:44a requesting refill of Xanax 0.5 (regular) to   [id number] - [address],  - [address] AT [facility] OF [address] [place] [address] Phone: [phone]  Fax: [phone]   He says he ran out over the weekend.  He said the pharmacy said they couldn't fill it past 11/4.  Next appt 4/18

## 2023-04-24 NOTE — Telephone Encounter (Signed)
LF 10/31; LV 04/18; NV 04/21

## 2023-05-06 IMAGING — CR DG SHOULDER 2+V*L*
4 series · 4 of 4 positions shown · non-contrast
Comparison: None.

CLINICAL DATA: Left shoulder pain. Limited range of motion.
Symptoms for 2 days.

EXAM:
LEFT SHOULDER - 2+ VIEW

[w shoulder grashey left (1 of 2)]
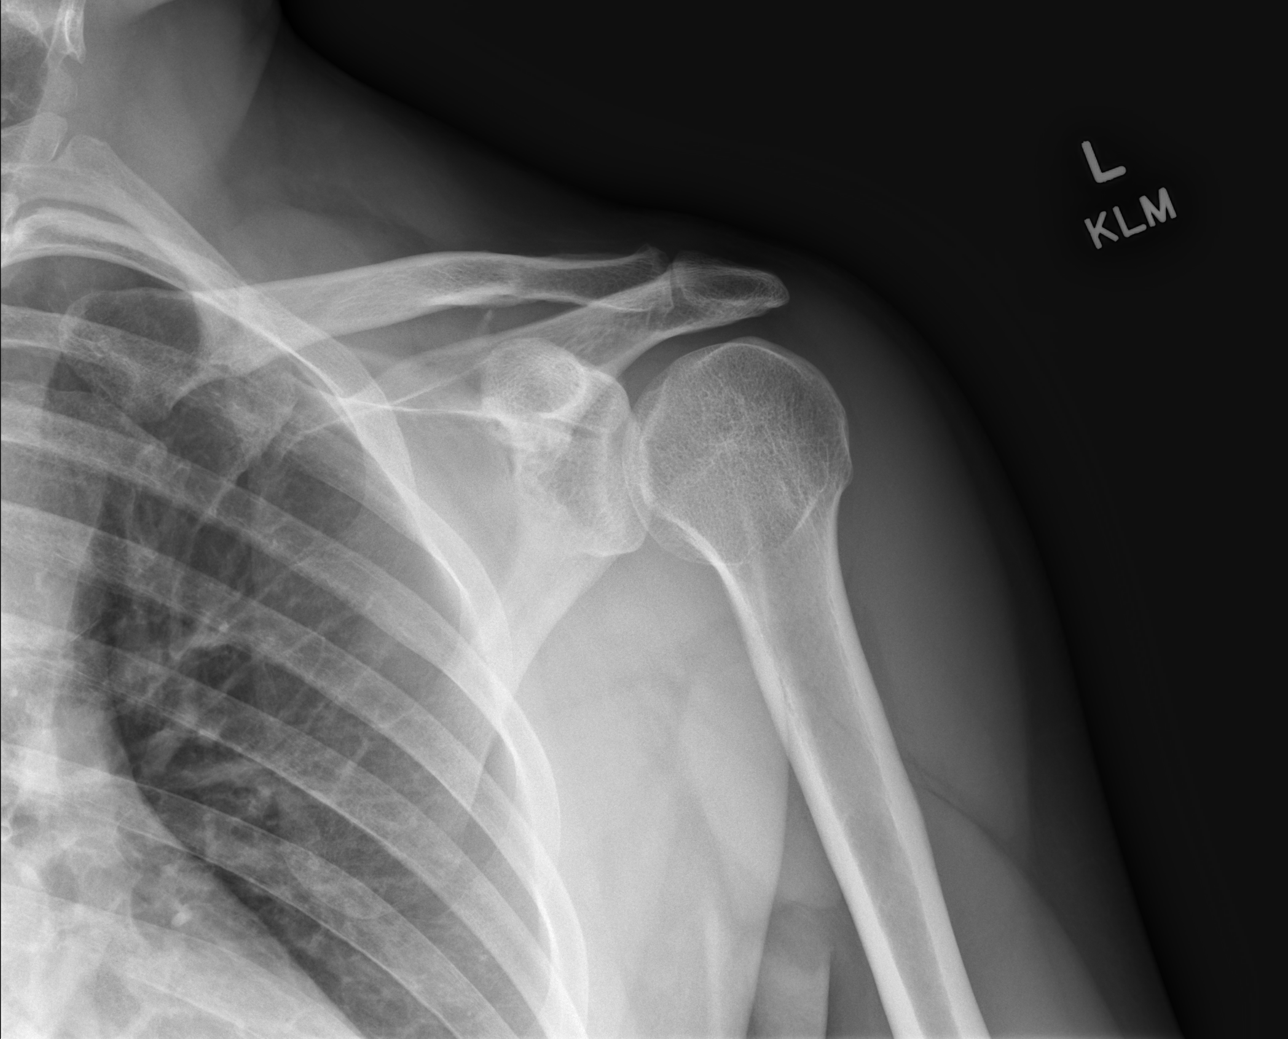

[w shoulder grashey left (2 of 2)]
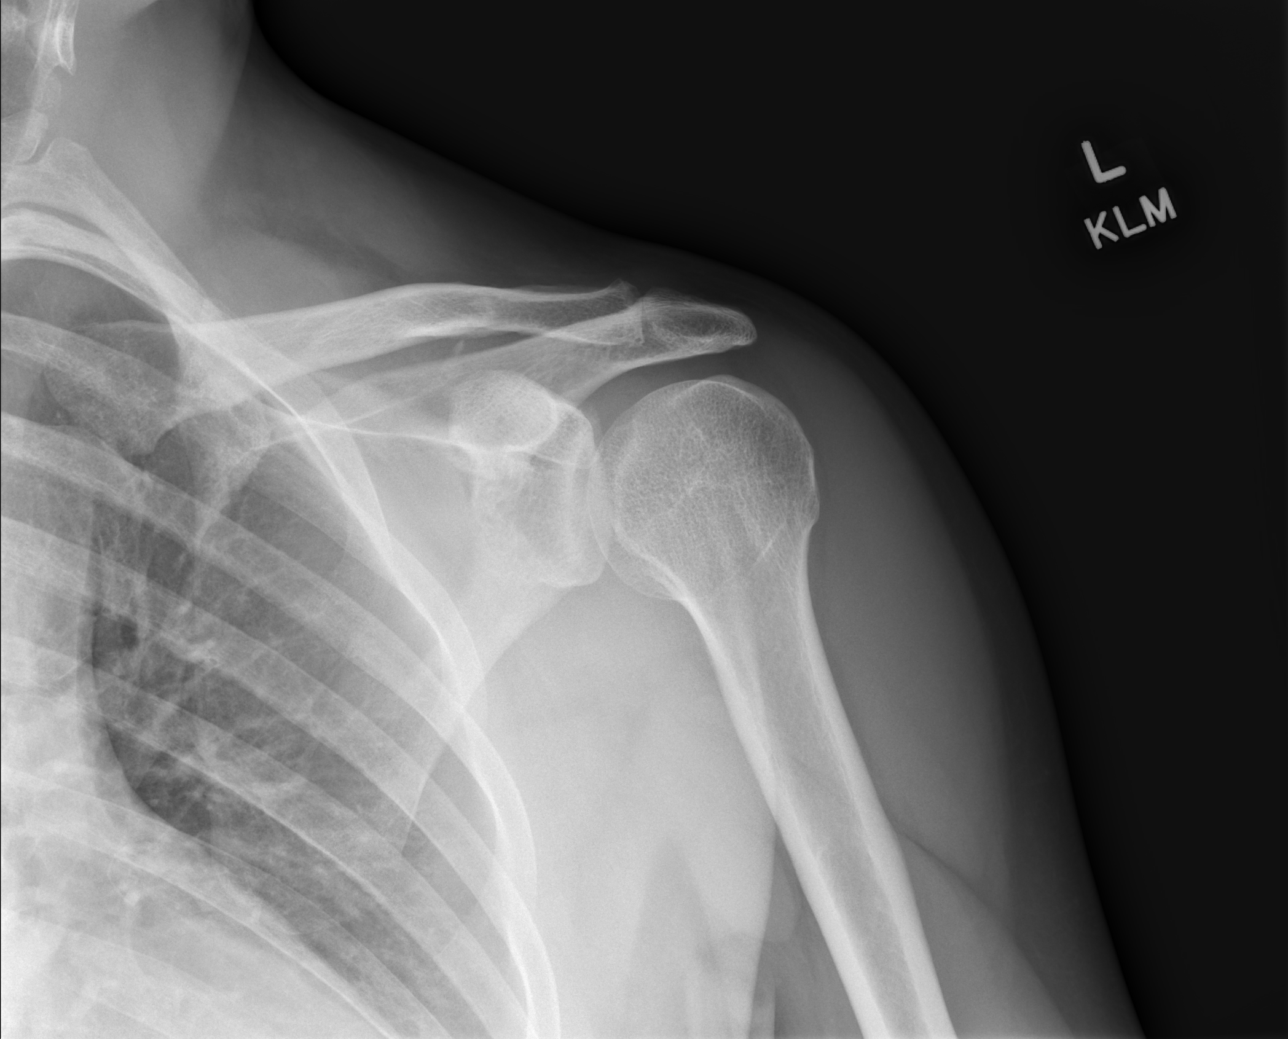

[w shoulder y-view left]
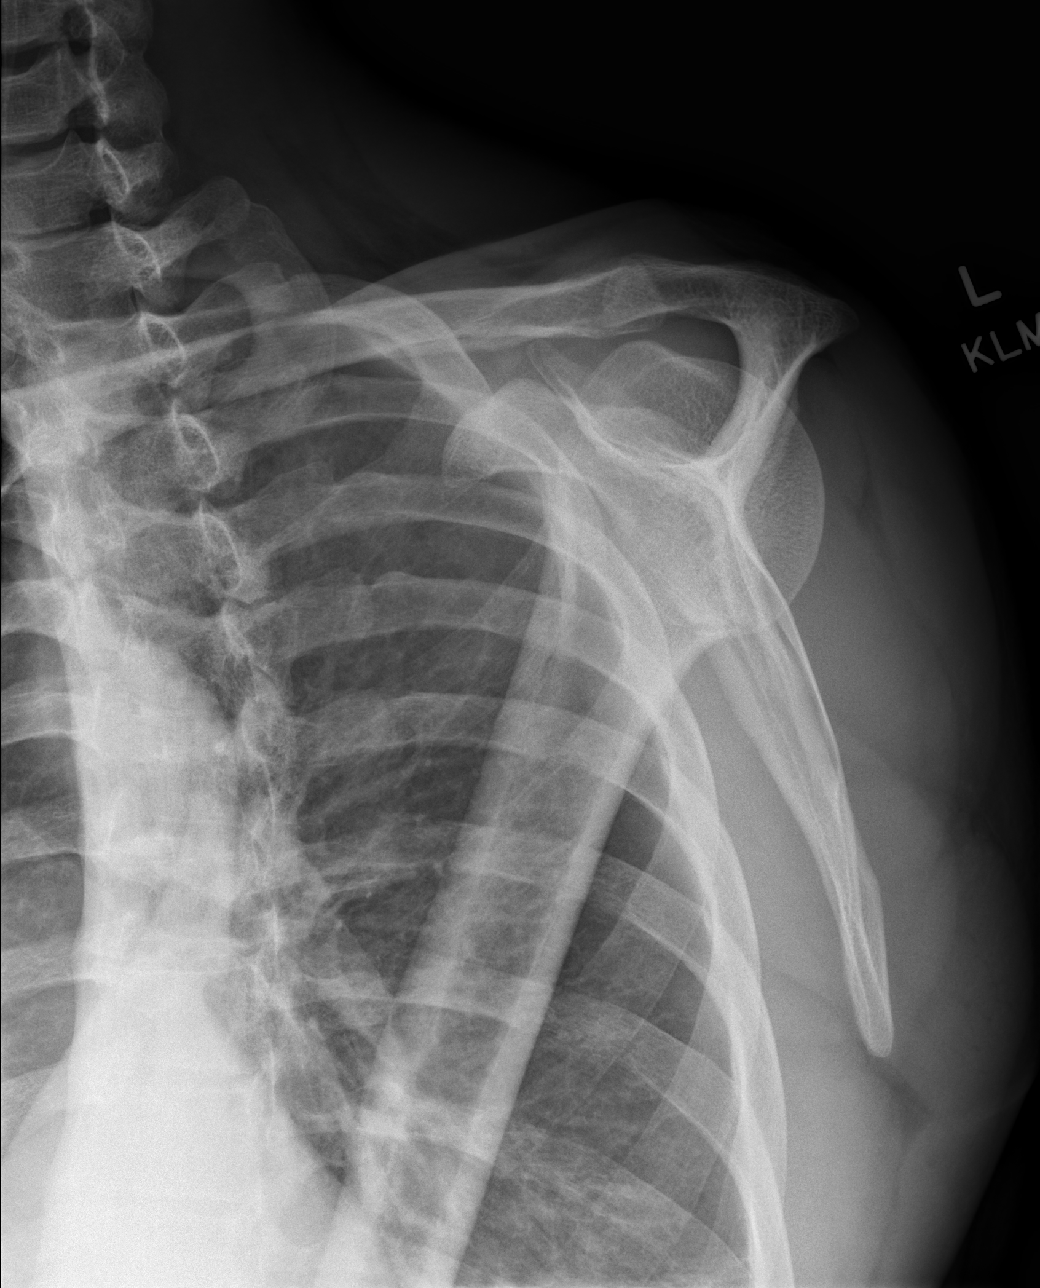

[x shoulder axillary left]
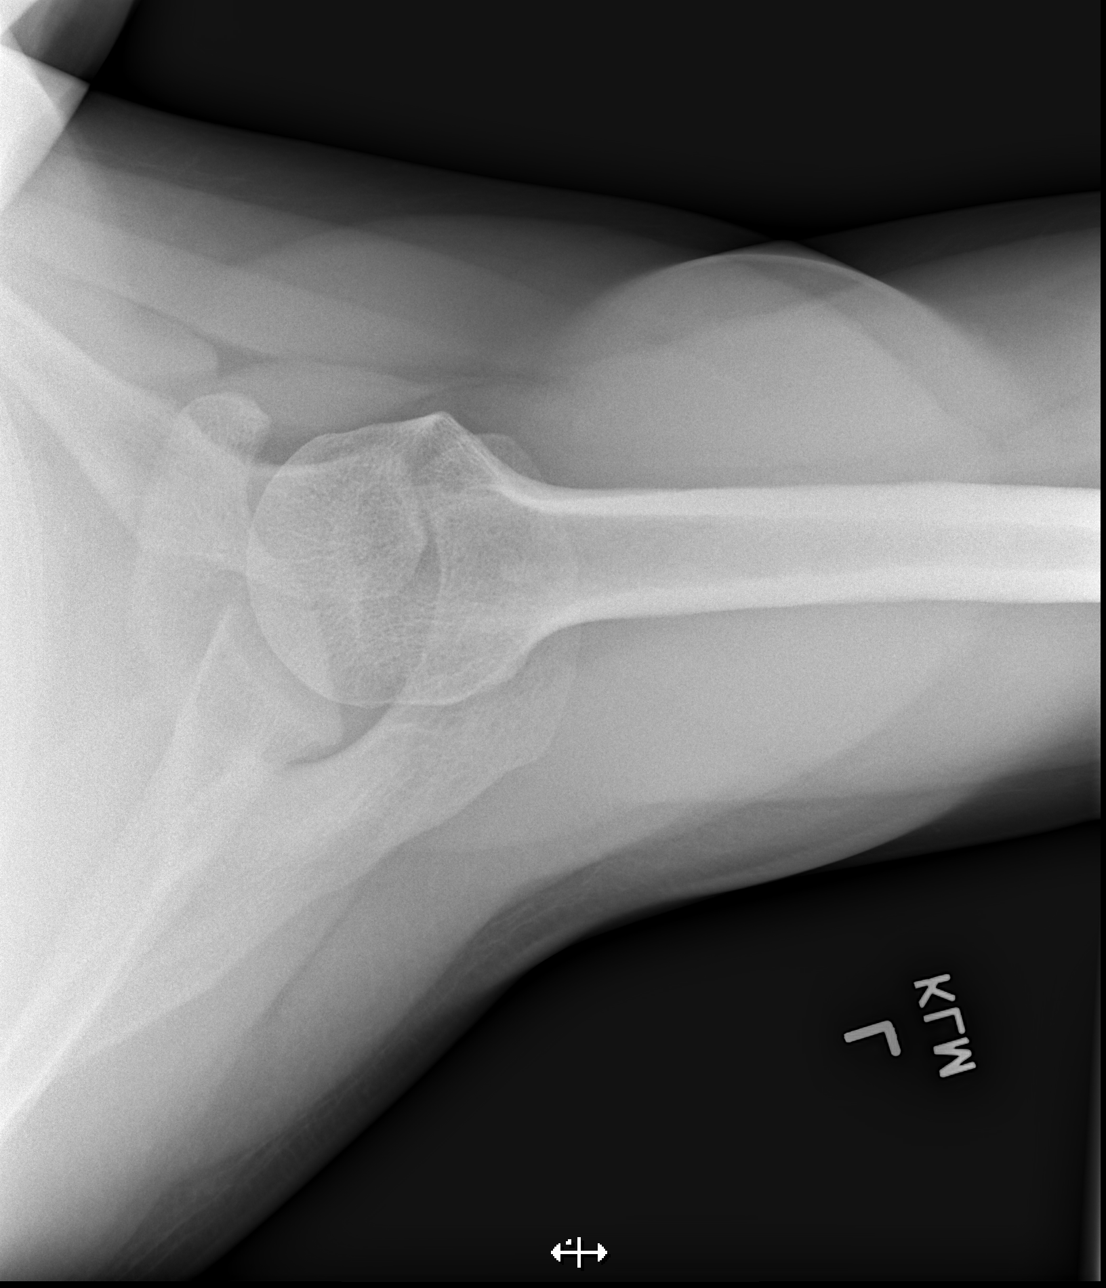

[4 of 4 positions shown; findings below may reference images not displayed]

FINDINGS: There is no evidence of fracture or dislocation. Normal alignment.
Glenohumeral joint space is normal. Trace acromioclavicular
spurring. No evidence of a vascular necrosis, erosion, or focal bone
abnormality. Soft tissues are unremarkable.
IMPRESSION: Trace acromioclavicular spurring. Otherwise unremarkable radiographs
of the left shoulder.

## 2023-06-22 ENCOUNTER — Other Ambulatory Visit: Payer: Self-pay | Admitting: Psychiatry

## 2023-06-22 DIAGNOSIS — F4001 Agoraphobia with panic disorder: Secondary | ICD-10-CM

## 2023-09-11 ENCOUNTER — Ambulatory Visit: Payer: PRIVATE HEALTH INSURANCE | Admitting: Psychiatry

## 2023-09-11 DIAGNOSIS — Z91199 Patient's noncompliance with other medical treatment and regimen due to unspecified reason: Secondary | ICD-10-CM

## 2023-09-11 NOTE — Progress Notes (Signed)
 Now show

## 2023-09-13 ENCOUNTER — Telehealth: Payer: Self-pay | Admitting: Psychiatry

## 2023-09-13 DIAGNOSIS — F4001 Agoraphobia with panic disorder: Secondary | ICD-10-CM

## 2023-09-13 MED ORDER — PAROXETINE HCL 20 MG PO TABS: ORAL_TABLET | ORAL | 0 refills | Status: DC

## 2023-09-13 NOTE — Telephone Encounter (Signed)
 Sent 60-day supply of Paxil  to get him to his appt.

## 2023-09-13 NOTE — Telephone Encounter (Signed)
 Next visit is 10/19/23. Eddie Parrish is requesting a refill on his Paroxetine  called to:  [id number] Jonette Nestle, [place] - [address] AT [facility] OF [address]   Phone: [phone]  Fax: [phone]

## 2023-10-19 ENCOUNTER — Encounter: Payer: Self-pay | Admitting: Psychiatry

## 2023-10-19 ENCOUNTER — Ambulatory Visit (INDEPENDENT_AMBULATORY_CARE_PROVIDER_SITE_OTHER): Payer: Self-pay | Admitting: Psychiatry

## 2023-10-19 DIAGNOSIS — F4001 Agoraphobia with panic disorder: Secondary | ICD-10-CM | POA: Diagnosis not present

## 2023-10-19 DIAGNOSIS — F17210 Nicotine dependence, cigarettes, uncomplicated: Secondary | ICD-10-CM | POA: Diagnosis not present

## 2023-10-19 MED ORDER — ALPRAZOLAM 0.5 MG PO TABS: ORAL_TABLET | ORAL | 5 refills | Status: DC

## 2023-10-19 MED ORDER — VARENICLINE TARTRATE 1 MG PO TABS: ORAL_TABLET | ORAL | 3 refills | Status: DC

## 2023-10-19 MED ORDER — PAROXETINE HCL ER 25 MG PO TB24
25.0000 mg | ORAL_TABLET | Freq: Every day | ORAL | 1 refills | Status: AC
Start: 2023-10-19 — End: ?

## 2023-10-19 NOTE — Progress Notes (Signed)
 [name] [id number] Feb 26, 1981 43 y.o.    Subjective:   Patient ID:  Eddie Parrish is a 43 y.o. (DOB 11-11-1980) male.  Chief Complaint:  Chief Complaint  Patient presents with   Follow-up   Anxiety   Medication Refill    HPI Eddie Parrish presents for follow-up of panic and GAD.  seen August 2019.  No med changes made.  10/11/19 appt without med changes noted: Pretty balanced.  No panic.  Stress high.  Stable.  Happy with meds and feels he needs all the Xanax  and it's smooth throughout the day.  Patient reports stable mood and denies depressed or irritable moods.  Anxiety manageable with meds.  Patient denies difficulty with sleep initiation or maintenance. Denies appetite disturbance.  Patient reports that energy and motivation have been good.  Patient denies any difficulty with concentration.  Patient denies any suicidal ideation. Kids 7, 6, 27 yo including baby and wife Eddie Parrish.  Eddie Parrish skin problem managed at [facility].  09/03/2020 appointment with following noted: Lost 15 #.   Out of alprazolam  XR but it's helpful. To use the combo. Business as usual.  F 72 dx terminal pancreatic CA.  No surgical option.  He's helping out. No full panic or unmanageable anxiety. I feel normal. No SE. Anxeity manageable.  Patient reports stable mood and denies depressed or irritable moods.  Patient denies any recent difficulty with anxiety.  Patient denies difficulty with sleep initiation or maintenance. Denies appetite disturbance.  Patient reports that energy and motivation have been good.  Patient denies any difficulty with concentration.  Patient denies any suicidal ideation. Plan:  He gets a very smooth benefit and effect response using the combination of Xanax  XR 0.5 mg twice daily and immediate release Xanax  0.5 4 times daily.  He is consistent.  There is no evidence of abuse or tolerance or misuse.  He will continue on Paxil  20 mg daily.  09/06/2021 appointment with the following  noted: Stayed on meds.   Healthy and family well. Youngest Eddie Parrish hives 5 yo. Wife Eddie Parrish doing well.   Doing well still with meds.  Anxiety managed and pleased with meds. Not close to panic.   F hospice care.Pancreatic CA Plan no med changes  09/08/22 appt noted: Continues combination of Xanax  XR 0.5 mg twice daily and immediate release Xanax  0.5 4 times daily.  He is consistent. Paxil  20 mg daily. Good routine with Xanax  schedule. Anxiety is managed.    Can get triggered if late with meds.  Can notice minor withdrawal if a coupld of hours late with meds.   No panic.  No sig avoidance.  No depression. Sleep pretty good. Managing same routine. No tolerance. No SE concerns. Plan: RX Chantix   10/19/23 appt noted: Med:   Paxil  20 mg daily. combination of Xanax  XR 0.5 mg twice daily and immediate release Xanax  0.5 mg tab 4 times daily.  Tried Chantix .  Not successful. Usually not taking the XR much anymore.  Don't need it usually. Feels defeated about smoking but working on it.   Not sure he gave Chantix  a good chance.   Anxiety manageable. But after stops smoking may want to consider other med changes. SE Feels fatigue a lot.  Reduced libido and suspects paroxetine  for  wt gain.  Lower motivation at home.     Past Psychiatric Medication Trials:   Paxil  20 benefit Zoloft worse anxiety,  Xanax , Klonopin,  melatonin NR, trazodone  Review of Systems:  Review of  Systems  Cardiovascular:  Negative for palpitations.  Neurological:  Negative for tremors.  Psychiatric/Behavioral:  Negative for dysphoric mood.     Medications: I have reviewed the patient's current medications.  Current Outpatient Medications  Medication Sig Dispense Refill   PARoxetine  (PAXIL  CR) 25 MG 24 hr tablet Take 1 tablet (25 mg total) by mouth daily. 90 tablet 1   ALPRAZolam  (XANAX ) 0.5 MG tablet TAKE 1 TABLET(0.5 MG) BY MOUTH FOUR TIMES DAILY 120 tablet 5   varenicline  (CHANTIX  CONTINUING MONTH PAK) 1 MG  tablet 1/2 tablet twice daily for 1 week, then 1 twice daily 60 tablet 3   No current facility-administered medications for this visit.    Medication Side Effects: None  Allergies:  Allergies  Allergen Reactions   Ativan [Lorazepam]    Keflex [Cephalexin]     Past Medical History:  Diagnosis Date   Anxiety    Migraine     Family History  Problem Relation Age of Onset   Cancer Other    Lung cancer Other    Liver cancer Other     Social History   Socioeconomic History   Marital status: Married    Spouse name: Not on file   Number of children: Not on file   Years of education: Not on file   Highest education level: Not on file  Occupational History   Not on file  Tobacco Use   Smoking status: Every Day    Current packs/day: 0.50    Types: Cigarettes   Smokeless tobacco: Never  Substance and Sexual Activity   Alcohol use: Yes    Comment: 10 beers weekly   Drug use: No   Sexual activity: Not on file  Other Topics Concern   Not on file  Social History Narrative   Patient lives at home with his wife and has a high school education. Patient works at [employer].     Social Drivers of Corporate investment banker Strain: Not on [employer] Insecurity: Not on file  Transportation Needs: Not on file  Physical Activity: Not on file  Stress: Not on file  Social Connections: Unknown (04/17/2022)   Received from [facility]   Social Network    Social Network: Not on file  Intimate Partner Violence: Unknown (04/17/2022)   Received from [facility]   HITS    Physically Hurt: Not on file    Insult or Talk Down To: Not on file    Threaten Physical Harm: Not on file    Scream or Curse: Not on file    Past Medical History, Surgical history, Social history, and Family history were reviewed and updated as appropriate.   Please see review of systems for further details on the patient's review from today.   Objective:   Physical  Exam:  There were no vitals taken for this visit.  Physical Exam Constitutional:      General: He is not in acute distress. Musculoskeletal:        General: No deformity.  Neurological:     Mental Status: He is alert and oriented to person, place, and time.     Cranial Nerves: No dysarthria.     Coordination: Coordination normal.  Psychiatric:        Attention and Perception: Attention and perception normal. He does not perceive auditory or visual hallucinations.        Mood and Affect: Mood normal. Mood is not anxious or depressed. Affect is  not labile or blunt.        Speech: Speech normal.        Behavior: Behavior normal. Behavior is not slowed. Behavior is cooperative.        Thought Content: Thought content normal. Thought content is not paranoid or delusional. Thought content does not include homicidal or suicidal ideation. Thought content does not include suicidal plan.        Cognition and Memory: Cognition and memory normal.        Judgment: Judgment normal.     Comments: Anxiety managed.     Lab Review:     Component Value Date/Time   NA 137 10/29/2009 1530   K 4.3 10/29/2009 1530   CL 102 10/29/2009 1530   CO2 30 10/29/2009 1530   GLUCOSE 89 10/29/2009 1530   BUN 19 10/29/2009 1530   CREATININE 1.09 10/29/2009 1530   CALCIUM 9.5 10/29/2009 1530   PROT 7.8 10/29/2009 1530   ALBUMIN 4.7 10/29/2009 1530   AST 21 10/29/2009 1530   ALT 22 10/29/2009 1530   ALKPHOS 30 (L) 10/29/2009 1530   BILITOT 0.4 10/29/2009 1530   GFRNONAA >60 10/29/2009 1530   GFRAA  10/29/2009 1530    >60        The eGFR has been calculated using the MDRD equation. This calculation has not been validated in all clinical situations. eGFR's persistently <60 mL/min signify possible Chronic Kidney Disease.       Component Value Date/Time   WBC 12.6 (H) 10/29/2009 1530   RBC 4.62 10/29/2009 1530   HGB 15.7 10/29/2009 1530   HCT 45.8 10/29/2009 1530   PLT 226 10/29/2009 1530   MCV  99.0 10/29/2009 1530   MCHC 34.4 10/29/2009 1530   RDW 12.8 10/29/2009 1530   LYMPHSABS 2.8 10/29/2009 1530   MONOABS 0.8 10/29/2009 1530   EOSABS 0.1 10/29/2009 1530   BASOSABS 0.1 10/29/2009 1530    No results found for: "POCLITH", "LITHIUM"   No results found for: "PHENYTOIN", "PHENOBARB", "VALPROATE", "CBMZ"   .res Assessment: Plan:    Panic disorder with agoraphobia - Plan: PARoxetine  (PAXIL  CR) 25 MG 24 hr tablet, ALPRAZolam  (XANAX ) 0.5 MG tablet  Cigarette nicotine dependence, uncomplicated - Plan: varenicline  (CHANTIX  CONTINUING MONTH PAK) 1 MG tablet   Coleman is had a good response with a combination of paroxetine  plus Xanax  for his panic disorder.  Attempts to reduce the Xanax  have failed and had recurrence of panic.  Higher doses of paroxetine  because side effects.  He gets a very smooth benefit and effect response using the combination of Xanax  XR 0.5 mg twice daily and immediate release Xanax  0.5 4 times daily.  He is consistent.   He will continue on Paxil  20 mg daily. There is no evidence of abuse or tolerance or misuse.  Trial paroxetine  cR 25 mg daily to see if energy better bc takes in in the morning.    We discussed the short-term risks associated with benzodiazepines including sedation and increased fall risk among others.  Discussed long-term side effect risk including dependence, potential withdrawal symptoms, and the potential eventual dose-related risk of dementia.  But recent studies from 2020 dispute this association between benzodiazepines and dementia risk. Newer studies in 2020 do not support an association with dementia.  Disc studies showing minimal risk of tolerance.    Cautioned about use of alcohol with alprazolam .  Rec stop smoking.  Extensive discussion of ways to do it.  Extensive discussion abotu SE Chantix .  He will retry Chantix   Follow-up 3 mos bc plans to try reducing paroxetine   Nori Beat, MD, DFAPA   Please see After Visit Summary for  patient specific instructions.  No future appointments.   No orders of the defined types were placed in this encounter.     -------------------------------

## 2023-12-28 ENCOUNTER — Encounter: Payer: Self-pay | Admitting: Psychiatry

## 2023-12-28 ENCOUNTER — Ambulatory Visit (INDEPENDENT_AMBULATORY_CARE_PROVIDER_SITE_OTHER): Admitting: Psychiatry

## 2023-12-28 DIAGNOSIS — F4001 Agoraphobia with panic disorder: Secondary | ICD-10-CM

## 2023-12-28 DIAGNOSIS — F17210 Nicotine dependence, cigarettes, uncomplicated: Secondary | ICD-10-CM | POA: Diagnosis not present

## 2023-12-28 MED ORDER — VARENICLINE TARTRATE 1 MG PO TABS: 1.0000 mg | ORAL_TABLET | Freq: Two times a day (BID) | ORAL | 3 refills | Status: AC

## 2023-12-28 NOTE — Progress Notes (Signed)
 Eddie Parrish [id number] April 14, 1981 43 y.o.    Subjective:   Patient ID:  Eddie Parrish is a 43 y.o. (DOB 12/16/1980) male.  Chief Complaint:  Chief Complaint  Patient presents with   Follow-up   Anxiety   Other    cigarrette    HPI Eddie Parrish presents for follow-up of panic and GAD.  seen August 2019.  No med changes made.  10/11/19 appt without med changes noted: Pretty balanced.  No panic.  Stress high.  Stable.  Happy with meds and feels he needs all the Xanax  and it's smooth throughout the day.  Patient reports stable mood and denies depressed or irritable moods.  Anxiety manageable with meds.  Patient denies difficulty with sleep initiation or maintenance. Denies appetite disturbance.  Patient reports that energy and motivation have been good.  Patient denies any difficulty with concentration.  Patient denies any suicidal ideation. Kids 7, 6, 48 yo including baby and wife Eddie Parrish.  Eddie Parrish skin problem managed at [facility].  09/03/2020 appointment with following noted: Lost 15 #.   Out of alprazolam  XR but it's helpful. To use the combo. Business as usual.  F 72 dx terminal pancreatic CA.  No surgical option.  He's helping out. No full panic or unmanageable anxiety. I feel normal. No SE. Anxeity manageable.  Patient reports stable mood and denies depressed or irritable moods.  Patient denies any recent difficulty with anxiety.  Patient denies difficulty with sleep initiation or maintenance. Denies appetite disturbance.  Patient reports that energy and motivation have been good.  Patient denies any difficulty with concentration.  Patient denies any suicidal ideation. Plan:  He gets a very smooth benefit and effect response using the combination of Xanax  XR 0.5 mg twice daily and immediate release Xanax  0.5 4 times daily.  He is consistent.  There is no evidence of abuse or tolerance or misuse.  He will continue on Paxil  20 mg daily.  09/06/2021 appointment with the following  noted: Stayed on meds.   Healthy and family well. Youngest Eddie Parrish hives 5 yo. Wife Eddie Parrish doing well.   Doing well still with meds.  Anxiety managed and pleased with meds. Not close to panic.   F hospice care.Pancreatic CA Plan no med changes  09/08/22 appt noted: Continues combination of Xanax  XR 0.5 mg twice daily and immediate release Xanax  0.5 4 times daily.  He is consistent. Paxil  20 mg daily. Good routine with Xanax  schedule. Anxiety is managed.    Can get triggered if late with meds.  Can notice minor withdrawal if a coupld of hours late with meds.   No panic.  No sig avoidance.  No depression. Sleep pretty good. Managing same routine. No tolerance. No SE concerns. Plan: RX Chantix   10/19/23 appt noted: Med:   Paxil  20 mg daily. combination of Xanax  XR 0.5 mg twice daily and immediate release Xanax  0.5 mg tab 4 times daily.  Tried Chantix .  Not successful. Usually not taking the XR much anymore.  Don't need it usually. Feels defeated about smoking but working on it.   Not sure he gave Chantix  a good chance.   Anxiety manageable. But after stops smoking may want to consider other med changes. SE Feels fatigue a lot.  Reduced libido and suspects paroxetine  for  wt gain.  Lower motivation at home.    12/28/23 appt noted:  Med: Paxil  CR 25 mg daily, Stopped Xanax  XR 0.5 mg twice daily and just on immediate release Xanax  0.5  mg tab 4 times daily.  Chantix .  No sig changes with Paxil  CR.  Anxiety pretty even keel. Vivid dreams with Chantix  but not NM helps with nicotine. Will stop beginning of Sept with nicotine.   Been able to reduce 30% # of cigarrettes.   Life is fatiguing.     Past Psychiatric Medication Trials:   Paxil  20 benefit Zoloft worse anxiety,  Xanax , Klonopin,  melatonin NR, trazodone  Review of Systems:  Review of Systems  Constitutional:  Positive for fatigue.  Cardiovascular:  Negative for palpitations.  Neurological:  Negative for tremors.   Psychiatric/Behavioral:  Negative for dysphoric mood.     Medications: I have reviewed the patient's current medications.  Current Outpatient Medications  Medication Sig Dispense Refill   ALPRAZolam  (XANAX ) 0.5 MG tablet TAKE 1 TABLET(0.5 MG) BY MOUTH FOUR TIMES DAILY 120 tablet 5   PARoxetine  (PAXIL  CR) 25 MG 24 hr tablet Take 1 tablet (25 mg total) by mouth daily. 90 tablet 1   varenicline  (CHANTIX  CONTINUING MONTH PAK) 1 MG tablet Take 1 tablet (1 mg total) by mouth 2 (two) times daily. 60 tablet 3   No current facility-administered medications for this visit.    Medication Side Effects: None  Allergies:  Allergies  Allergen Reactions   Ativan [Lorazepam]    Keflex [Cephalexin]     Past Medical History:  Diagnosis Date   Anxiety    Migraine     Family History  Problem Relation Age of Onset   Cancer Other    Lung cancer Other    Liver cancer Other     Social History   Socioeconomic History   Marital status: Married    Spouse name: Not on file   Number of children: Not on file   Years of education: Not on file   Highest education level: Not on file  Occupational History   Not on file  Tobacco Use   Smoking status: Every Day    Current packs/day: 0.50    Types: Cigarettes   Smokeless tobacco: Never  Substance and Sexual Activity   Alcohol use: Yes    Comment: 10 beers weekly   Drug use: No   Sexual activity: Not on file  Other Topics Concern   Not on file  Social History Narrative   Patient lives at home with his wife and has a high school education. Patient works at [employer].     Social Drivers of Corporate investment banker Strain: Not on [employer] Insecurity: Not on file  Transportation Needs: Not on file  Physical Activity: Not on file  Stress: Not on file  Social Connections: Unknown (04/17/2022)   Received from [place]   Social Network    Social Network: Not on file  Intimate Partner Violence: Unknown (04/17/2022)   Received  from [facility]   HITS    Physically Hurt: Not on file    Insult or Talk Down To: Not on file    Threaten Physical Harm: Not on file    Scream or Curse: Not on file    Past Medical History, Surgical history, Social history, and Family history were reviewed and updated as appropriate.   Please see review of systems for further details on the patient's review from today.   Objective:   Physical Exam:  There were no vitals taken for this visit.  Physical Exam Constitutional:      General: He is not in acute distress. Musculoskeletal:  General: No deformity.  Neurological:     Mental Status: He is alert and oriented to person, place, and time.     Cranial Nerves: No dysarthria.     Coordination: Coordination normal.  Psychiatric:        Attention and Perception: Attention and perception normal. He does not perceive auditory or visual hallucinations.        Mood and Affect: Mood normal. Mood is not anxious or depressed.        Speech: Speech normal.        Behavior: Behavior normal. Behavior is not slowed. Behavior is cooperative.        Thought Content: Thought content normal. Thought content is not paranoid or delusional. Thought content does not include homicidal or suicidal ideation. Thought content does not include suicidal plan.        Cognition and Memory: Cognition and memory normal.        Judgment: Judgment normal.     Comments: Anxiety managed.     Lab Review:     Component Value Date/Time   NA 137 10/29/2009 1530   K 4.3 10/29/2009 1530   CL 102 10/29/2009 1530   CO2 30 10/29/2009 1530   GLUCOSE 89 10/29/2009 1530   BUN 19 10/29/2009 1530   CREATININE 1.09 10/29/2009 1530   CALCIUM 9.5 10/29/2009 1530   PROT 7.8 10/29/2009 1530   ALBUMIN 4.7 10/29/2009 1530   AST 21 10/29/2009 1530   ALT 22 10/29/2009 1530   ALKPHOS 30 (L) 10/29/2009 1530   BILITOT 0.4 10/29/2009 1530   GFRNONAA >60 10/29/2009 1530   GFRAA  10/29/2009 1530    >60        The  eGFR has been calculated using the MDRD equation. This calculation has not been validated in all clinical situations. eGFR's persistently <60 mL/min signify possible Chronic Kidney Disease.       Component Value Date/Time   WBC 12.6 (H) 10/29/2009 1530   RBC 4.62 10/29/2009 1530   HGB 15.7 10/29/2009 1530   HCT 45.8 10/29/2009 1530   PLT 226 10/29/2009 1530   MCV 99.0 10/29/2009 1530   MCHC 34.4 10/29/2009 1530   RDW 12.8 10/29/2009 1530   LYMPHSABS 2.8 10/29/2009 1530   MONOABS 0.8 10/29/2009 1530   EOSABS 0.1 10/29/2009 1530   BASOSABS 0.1 10/29/2009 1530    No results found for: POCLITH, LITHIUM   No results found for: PHENYTOIN, PHENOBARB, VALPROATE, CBMZ   .res Assessment: Plan:    Panic disorder with agoraphobia  Cigarette nicotine dependence, uncomplicated - Plan: varenicline  (CHANTIX  CONTINUING MONTH PAK) 1 MG tablet   Fenris is had a good response with a combination of paroxetine  plus Xanax  for his panic disorder.  Attempts to reduce the Xanax  have failed and had recurrence of panic.  Higher doses of paroxetine  because side effects.  He gets a very smooth benefit and effect response using the combination of Xanax  XR 0.5 mg twice daily and immediate release Xanax  0.5 4 times daily.  He is consistent.    There is no evidence of abuse or tolerance or misuse.  We discussed the short-term risks associated with benzodiazepines including sedation and increased fall risk among others.  Discussed long-term side effect risk including dependence, potential withdrawal symptoms, and the potential eventual dose-related risk of dementia.  But recent studies from 2020 dispute this association between benzodiazepines and dementia risk. Newer studies in 2020 do not support an association with dementia.  Disc studies showing minimal  risk of tolerance.    Cautioned about use of alcohol with alprazolam .  Rec stop smoking.  Extensive discussion of ways to do it.  Extensive  discussion abotu SE Chantix .  He continue Chantix   After stops smoking,  we will try a reduction in paroxetine  to 15 mg daily to see if energy, motivation is better since anxiety well controlled for a long time.  He's been able to eliminate the Xanax  XR and just take IR 0.5 mg QID  Follow-up 6 mos  Lorene Macintosh, MD, DFAPA   Please see After Visit Summary for patient specific instructions.  Future Appointments  Date Time Provider Department Center  07/04/2024  4:30 PM Cottle, Lorene KANDICE Raddle., MD [facility] None     No orders of the defined types were placed in this encounter.     -------------------------------

## 2024-03-06 ENCOUNTER — Other Ambulatory Visit: Payer: Self-pay | Admitting: Psychiatry

## 2024-03-06 DIAGNOSIS — F4001 Agoraphobia with panic disorder: Secondary | ICD-10-CM

## 2024-04-15 ENCOUNTER — Other Ambulatory Visit: Payer: Self-pay | Admitting: Psychiatry

## 2024-04-15 DIAGNOSIS — F4001 Agoraphobia with panic disorder: Secondary | ICD-10-CM

## 2024-07-04 ENCOUNTER — Ambulatory Visit: Admitting: Psychiatry
